# Patient Record
Sex: Female | Born: 1955 | Race: White | Hispanic: No | Smoking: Former smoker
Health system: Southern US, Community
[De-identification: ages and names within clinical notes are randomized; demographics above are authoritative.]

## PROBLEM LIST (undated history)

## (undated) DIAGNOSIS — M810 Age-related osteoporosis without current pathological fracture: Secondary | ICD-10-CM

## (undated) DIAGNOSIS — L409 Psoriasis, unspecified: Secondary | ICD-10-CM

## (undated) DIAGNOSIS — I10 Essential (primary) hypertension: Secondary | ICD-10-CM

## (undated) HISTORY — PX: ABDOMINAL HYSTERECTOMY: SHX81

## (undated) HISTORY — PX: ARTHROSCOPIC REPAIR ACL: SUR80

## (undated) HISTORY — DX: Age-related osteoporosis without current pathological fracture: M81.0

## (undated) HISTORY — DX: Essential (primary) hypertension: I10

## (undated) HISTORY — PX: RHINOPLASTY: SUR1284

---

## 1999-07-07 ENCOUNTER — Encounter (INDEPENDENT_AMBULATORY_CARE_PROVIDER_SITE_OTHER): Payer: Self-pay

## 1999-07-07 ENCOUNTER — Other Ambulatory Visit: Admission: RE | Admit: 1999-07-07 | Discharge: 1999-07-07 | Payer: Self-pay | Admitting: Obstetrics & Gynecology

## 1999-12-03 ENCOUNTER — Encounter: Admission: RE | Admit: 1999-12-03 | Discharge: 1999-12-03 | Payer: Self-pay | Admitting: Obstetrics & Gynecology

## 1999-12-03 ENCOUNTER — Encounter: Payer: Self-pay | Admitting: Obstetrics & Gynecology

## 2000-12-28 ENCOUNTER — Encounter: Admission: RE | Admit: 2000-12-28 | Discharge: 2000-12-28 | Payer: Self-pay | Admitting: Obstetrics & Gynecology

## 2000-12-28 ENCOUNTER — Encounter: Payer: Self-pay | Admitting: Obstetrics & Gynecology

## 2001-01-05 ENCOUNTER — Other Ambulatory Visit: Admission: RE | Admit: 2001-01-05 | Discharge: 2001-01-05 | Payer: Self-pay | Admitting: Obstetrics & Gynecology

## 2001-12-30 ENCOUNTER — Encounter: Payer: Self-pay | Admitting: Obstetrics & Gynecology

## 2001-12-30 ENCOUNTER — Encounter: Admission: RE | Admit: 2001-12-30 | Discharge: 2001-12-30 | Payer: Self-pay | Admitting: Obstetrics & Gynecology

## 2002-03-14 ENCOUNTER — Other Ambulatory Visit: Admission: RE | Admit: 2002-03-14 | Discharge: 2002-03-14 | Payer: Self-pay | Admitting: Obstetrics & Gynecology

## 2003-01-09 ENCOUNTER — Encounter: Payer: Self-pay | Admitting: Obstetrics & Gynecology

## 2003-01-09 ENCOUNTER — Encounter: Admission: RE | Admit: 2003-01-09 | Discharge: 2003-01-09 | Payer: Self-pay | Admitting: Obstetrics & Gynecology

## 2003-04-10 ENCOUNTER — Other Ambulatory Visit: Admission: RE | Admit: 2003-04-10 | Discharge: 2003-04-10 | Payer: Self-pay | Admitting: Obstetrics & Gynecology

## 2003-07-06 ENCOUNTER — Ambulatory Visit (HOSPITAL_COMMUNITY): Admission: RE | Admit: 2003-07-06 | Discharge: 2003-07-06 | Payer: Self-pay | Admitting: Family Medicine

## 2004-01-25 ENCOUNTER — Encounter: Admission: RE | Admit: 2004-01-25 | Discharge: 2004-01-25 | Payer: Self-pay | Admitting: Obstetrics & Gynecology

## 2005-01-30 ENCOUNTER — Encounter: Admission: RE | Admit: 2005-01-30 | Discharge: 2005-01-30 | Payer: Self-pay | Admitting: Emergency Medicine

## 2005-02-16 ENCOUNTER — Encounter: Admission: RE | Admit: 2005-02-16 | Discharge: 2005-02-16 | Payer: Self-pay | Admitting: Emergency Medicine

## 2005-04-24 ENCOUNTER — Encounter: Admission: RE | Admit: 2005-04-24 | Discharge: 2005-04-24 | Payer: Self-pay | Admitting: Emergency Medicine

## 2005-09-10 ENCOUNTER — Ambulatory Visit (HOSPITAL_COMMUNITY): Admission: RE | Admit: 2005-09-10 | Discharge: 2005-09-10 | Payer: Self-pay | Admitting: Obstetrics and Gynecology

## 2005-09-10 ENCOUNTER — Encounter (INDEPENDENT_AMBULATORY_CARE_PROVIDER_SITE_OTHER): Payer: Self-pay | Admitting: Specialist

## 2005-10-02 ENCOUNTER — Inpatient Hospital Stay (HOSPITAL_COMMUNITY): Admission: RE | Admit: 2005-10-02 | Discharge: 2005-10-03 | Payer: Self-pay | Admitting: Obstetrics & Gynecology

## 2005-10-02 ENCOUNTER — Encounter (INDEPENDENT_AMBULATORY_CARE_PROVIDER_SITE_OTHER): Payer: Self-pay | Admitting: Specialist

## 2006-02-17 ENCOUNTER — Encounter: Admission: RE | Admit: 2006-02-17 | Discharge: 2006-02-17 | Payer: Self-pay | Admitting: Emergency Medicine

## 2007-02-21 ENCOUNTER — Encounter: Admission: RE | Admit: 2007-02-21 | Discharge: 2007-02-21 | Payer: Self-pay | Admitting: Emergency Medicine

## 2008-02-24 ENCOUNTER — Encounter: Admission: RE | Admit: 2008-02-24 | Discharge: 2008-02-24 | Payer: Self-pay | Admitting: Emergency Medicine

## 2009-02-25 ENCOUNTER — Encounter: Admission: RE | Admit: 2009-02-25 | Discharge: 2009-02-25 | Payer: Self-pay | Admitting: Emergency Medicine

## 2009-10-18 ENCOUNTER — Ambulatory Visit (HOSPITAL_COMMUNITY): Admission: RE | Admit: 2009-10-18 | Discharge: 2009-10-18 | Payer: Self-pay | Admitting: Dermatology

## 2010-02-26 ENCOUNTER — Encounter: Admission: RE | Admit: 2010-02-26 | Discharge: 2010-02-26 | Payer: Self-pay | Admitting: Family Medicine

## 2010-06-29 ENCOUNTER — Encounter: Payer: Self-pay | Admitting: Obstetrics & Gynecology

## 2010-07-10 ENCOUNTER — Other Ambulatory Visit (HOSPITAL_COMMUNITY): Payer: Self-pay | Admitting: Family Medicine

## 2010-08-26 LAB — CBC
Hemoglobin: 14.5 g/dL (ref 12.0–15.0)
MCV: 100.8 fL — ABNORMAL HIGH (ref 78.0–100.0)
Platelets: 201 10*3/uL (ref 150–400)
RBC: 4.14 MIL/uL (ref 3.87–5.11)
WBC: 6.2 10*3/uL (ref 4.0–10.5)

## 2010-08-26 LAB — APTT: aPTT: 33 seconds (ref 24–37)

## 2010-08-26 LAB — PROTIME-INR: INR: 0.98 (ref 0.00–1.49)

## 2010-10-24 NOTE — Op Note (Signed)
[name], [name]               ACCOUNT NO.:  [id number]   MEDICAL RECORD NO.:  [id number]          PATIENT TYPE:  AMB   LOCATION:  SDC                           FACILITY:  [facility]   PHYSICIAN:  Richardean Sale, M.D.   DATE OF BIRTH:  04-04-1956   DATE OF PROCEDURE:  09/10/2005  DATE OF DISCHARGE:                                 OPERATIVE REPORT   PREOPERATIVE DIAGNOSIS:  Prolapsed submucosal uterine fibroid.   POSTOPERATIVE DIAGNOSIS:  Prolapsed submucosal uterine fibroid.   PROCEDURE:  Vaginal myomectomy, hysteroscopy with resection of uterine  fibroid and dilatation and curettage.   SURGEON:  Richardean Sale, M.D.   ASSISTANT:  None.   COMPLICATIONS:  None.   ESTIMATED BLOOD LOSS:  Minimal.   FLUID DEFICIT:  150 mL.   FINDINGS:  An approximately 4 cm diameter submucosal fibroid prolapsing  through the uterine cervix into the vagina with the base of approximately 2  cm in diameter.   INDICATIONS:  This is a 55 year old para 1 white female who presented to the  office with 5 days of heavy vaginal bleeding and vaginal discharge with  lower back and lower pelvic cramping.  On evaluation, the patient was found  to have a 4 cm diameter uterine fibroid prolapsing through the cervix.  The  patient presents for vaginal myomectomy with possible hysteroscopy, with  resection of uterine fibroid and D&C.  Prior to procedure, the risks,  benefits and alternatives of the procedure were reviewed with the patient in  detail.  We discussed the risks which include but are not limited to  hemorrhage requiring transfusion, infection, uterine perforation which could  cause injury to bowel or bladder and require additional surgery, and  anesthesia related risk.  The patient voiced understanding of all these  risks and agrees to proceed.  Informed consent was then obtained.   PROCEDURE:  The patient was taken to the operating room where she was given  a general anesthetic.  She was then prepped  and draped in the usual sterile  fashion with Betadine.  A red rubber catheter was used to drain the bladder.  On bimanual exam, a fibroid was easily palpable in the cervical os  approximately 4 cm in diameter and at least protruding by 4 cm as well.  The  uterus was otherwise normal size, retroverted, mobile with no obvious  adnexal masses.  A speculum was then placed in the vagina and the fibroid  was easily visualized.  It was grasped with single-tooth tenaculum.  The  base was approximately 2 cm in diameter and the fibroid was unable to be  removed with a twisting motion.  Therefore, the Bovie was used after the  Bovie pad had been secured to the patient's thigh.  The Bovie was used to  transect the fibroid at the visible base.  The specimen was then sent to  pathology labeled as uterine fibroid.  The base appeared hemostatic but  there still appeared to be a significant amount of the fibroid present in  the endocervical canal.  Therefore, the operative hysteroscope was  introduced first by  placing a single-tooth tenaculum on the anterior lip of  the cervix.  With entry of the hysteroscope, there was significant amount of  fluid being lost from the cervix as the cervix was already significantly  dilated from the prolapse fibroid.  Therefore, a second tenaculum was then  placed along the posterior lip of the cervix to help lessen the diameter of  the cervix and hysteroscopy was then able to be performed.  Upon inspection  of the cavity, the base of the fibroid could be visualized.  It appeared  hemostatic but there was still approximately 2 cm of fibroid remaining.  Therefore, the resectoscope was then used to shave down the base of the  fibroid.  These specimens were also sent to pathology with the original  uterine fibroid.  Once this was completed, a sharp curettage was performed.  The multiple small fibroids fragments of fibroid were removed.  The polyp  forceps were also used to  help remove any additional fragments.  Once this  was complete, the hysteroscope was then reintroduced.  The suction tubing  then spontaneously disconnected from the hysteroscope and approximately 300  mL of fluid were lost on the floor.  Suction tubing was replaced.  Hysteroscopy was performed.  There were multiple small fibroid pieces  floating in the endometrial cavity.  Hysteroscope was removed and again the  forceps were then used to remove any additional fragments.  The hysteroscope  was reintroduced one last time.  The endometrial cavity now appeared normal.  There was a small amount of bleeding coming from the base of the fibroid.  This was cauterized with the wire loop for the resectoscope.  Hemostasis was  then confirmed.  The hysteroscope was then removed and the tenaculum was  removed from the patient's cervix.  There was minimal bleeding coming from  the cervical os and no bleeding coming from the tenaculum site.  At this  point, the procedure was then terminated.  All sponge, lap, needles and  instrument counts were correct x2.  The patient was then taken out of the  dorsal lithotomy position, was awakened from anesthesia and was transferred  to recovery room awake and in stable condition.  There were no  complications.      Richardean Sale, M.D.  Electronically Signed     JW/MEDQ  D:  09/10/2005  T:  09/11/2005  Job:  [id number]

## 2010-10-24 NOTE — Op Note (Signed)
Karla, [name]               ACCOUNT NO.:  [id number]   MEDICAL RECORD NO.:  [id number]          PATIENT TYPE:  INP   LOCATION:  1603                         FACILITY:  [facility]   PHYSICIAN:  Genia Del, M.D.DATE OF BIRTH:  1955-06-09   DATE OF PROCEDURE:  10/02/2005  DATE OF DISCHARGE:  10/03/2005                                 OPERATIVE REPORT   PREOPERATIVE DIAGNOSES:  1.  Refractory menorrhagia.  2.  Uterine myoma.   POSTOPERATIVE DIAGNOSES:  1.  Refractory menorrhagia.  2.  Uterine myoma.  3.  Mild pelvic endometriosis.   PROCEDURE:  Laparoscopy-assisted vaginal hysterectomy plus bilateral  salpingo-oophorectomy, da Vinci robot-assisted.   SURGEON:  Genia Del, M.D.   ASSISTANT:  Richardean Sale, M.D.   PROCEDURE:  Under general anesthesia with endotracheal intubation, the  patient is in lithotomy position for operative laparoscopy.  She is prepped  with Betadine on the abdominal, suprapubic, vulvar and vaginal areas and  draped as usual.  The bladder is catheterized and the Foley is kept in  place.  We go vaginally and insert the weighted speculum.  We grasp the  anterior lip of the cervix with a tenaculum.  The uterus is cannulated with  the ZUMI and a co-ring is used at the cervix.  A stitch of 0 Vicryl is done  at that level on each side to secure the co-ring.  We then remove the  weighted speculum.  We go abdominally.  We measure the site for the camera  at 19 cm above the symphysis pubis, which is about 3 cm above the umbilicus.  We make a 1 cm incision with a scalpel at that level.  We use the Veress  needle to create a pneumoperitoneum.  The abdominal wall is raised as the  Veress needle is inserted.  The security tests are done.  We then create a  pneumoperitoneum with 2.5 L of CO2.  We then remove the Veress needle.  The  trocar is inserted at that level and the camera is inserted.  The uterus is  slightly irregular, mild increase in volume  because of fibroids.  We note  two normal tubes.  The ovaries are normal in size and appearance on each  side.  There is endometriosis, especially lesions are seen at the  uterosacral ligament on the left side and toward the left infundibulopelvic  ligament as well.  Other lesions are more superficial.  No adhesion is  present.  We then make the measurement for the port sites.  We put the two  main robotic arms on the right and left about at the level of the umbilicus.  We put the fourth arm on the left and the assistant port on the right.  We  make incisions with a scalpel measuring about 5 mm at each port entrance  except for the assistant port, which is 10 mm.  Marcaine 0.25% plain is  injected subcutaneously at all levels.  We then insert all the trocars under  direct vision.  We then dock the robot easily.  The instruments are put in  place,  on the right arm a shear scissors, on the left arm the [place]  bipolar, and on the fourth arm the tenaculum.  My assistant uses a traumatic  clamp to retract the uterus on each side.  We start on the left side.  Round  ligament is cauterized and cut using the [place] and the shear scissors.  We then open the peritoneum anteriorly and start declining the bladder on  that side.  We then visualize the last ureter.  This is made more difficult  by the endometriosis at that level.  We cauterize and cut the left  infundibulopelvic ligament.  We follow along the side of the uterus and  cauterize the left uterine artery.  We then continue on the right side and  proceed exactly the same way.  The right ureter is more easily visualized  and away from the surgical area.  The round ligament is cauterized and cut.  The right infundibulopelvic ligament is cauterized and cut and we go along  the uterus on the right side.  We reach the right uterine artery.  It is  cauterized and then cut.  We cut the left uterine artery after cauterization  also.  We recline  the bladder further down until we see about 2 cm of the  vaginal wall.  We then push the co-ring and start the circular resection  with the point of the monopolar scissors all around.  We start anteriorly to  the left lateral aspect of the vagina and then proceed to the right aspect  and then finish posteriorly.  We remove the uterus with the cervix, tubes  and both ovaries in one block and send it to pathology.  We then put a glove  with a lap inside to occlude the vagina to preserve the pneumoperitoneum.  We then close the vaginal vault with a Vicryl 0 10 inches in two half-  running sutures.  The vaginal vault is completely closed with that.  We then  verify hemostasis at all pedicles.  We remove the pneumoperitoneum partially  to decrease the pressure.  Hemostasis is adequate at all levels.  The  instruments are therefore removed.  The robot is undocked.  The CO2 is  evacuated.  All trocars are removed.  We close the aponeurosis with a Vicryl  0 at the supraumbilical incision.  The other incisions are too small to  close it.  We use a 4-0 Vicryl to do a subcuticular stitch at the  supraumbilical incision and at the assistant port incision, and we put  Dermabond on all incisions.  The count of instruments and sponges was  complete.  The estimated blood loss was less than 100 mL.  No complication  occurred, and the patient was brought to the recovery room in good status.      Genia Del, M.D.  Electronically Signed     ML/MEDQ  D:  10/02/2005  T:  10/03/2005  Job:  [id number]

## 2011-01-27 ENCOUNTER — Other Ambulatory Visit: Payer: Self-pay | Admitting: Family Medicine

## 2011-01-27 DIAGNOSIS — Z1231 Encounter for screening mammogram for malignant neoplasm of breast: Secondary | ICD-10-CM

## 2011-03-02 ENCOUNTER — Ambulatory Visit
Admission: RE | Admit: 2011-03-02 | Discharge: 2011-03-02 | Disposition: A | Discharge status: Home / Self Care | Payer: 59 | Source: Ambulatory Visit | Attending: Family Medicine | Admitting: Family Medicine

## 2011-03-02 DIAGNOSIS — Z1231 Encounter for screening mammogram for malignant neoplasm of breast: Secondary | ICD-10-CM

## 2011-03-06 ENCOUNTER — Other Ambulatory Visit: Payer: Self-pay | Admitting: Family Medicine

## 2011-03-06 DIAGNOSIS — N63 Unspecified lump in unspecified breast: Secondary | ICD-10-CM

## 2011-03-23 ENCOUNTER — Other Ambulatory Visit: Payer: Self-pay | Admitting: Family Medicine

## 2011-03-23 ENCOUNTER — Ambulatory Visit
Admission: RE | Admit: 2011-03-23 | Discharge: 2011-03-23 | Disposition: A | Discharge status: Home / Self Care | Payer: 59 | Source: Ambulatory Visit | Attending: Family Medicine | Admitting: Family Medicine

## 2011-03-23 DIAGNOSIS — N63 Unspecified lump in unspecified breast: Secondary | ICD-10-CM

## 2011-05-17 IMAGING — MG MM DIGITAL DIAGNOSTIC BILAT
5 series · 5 of 5 positions shown · non-contrast
Comparison: [DATE] [DATE], [DATE], [DATE] [DATE], [DATE], [DATE] [DATE],
[DATE]

CLINICAL DATA: Palpable lumps left breast

DIGITAL DIAGNOSTIC BILATERAL MAMMOGRAM WITH CAD AND LEFT BREAST
ULTRASOUND:

[R CC]
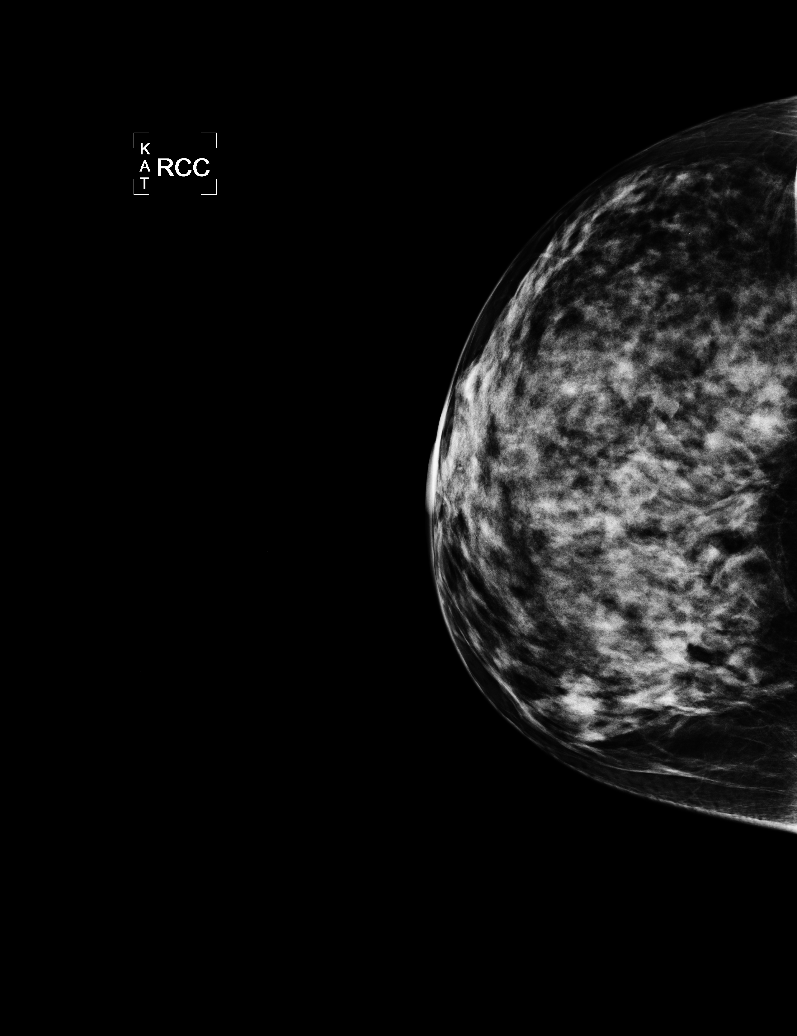

[L CC]
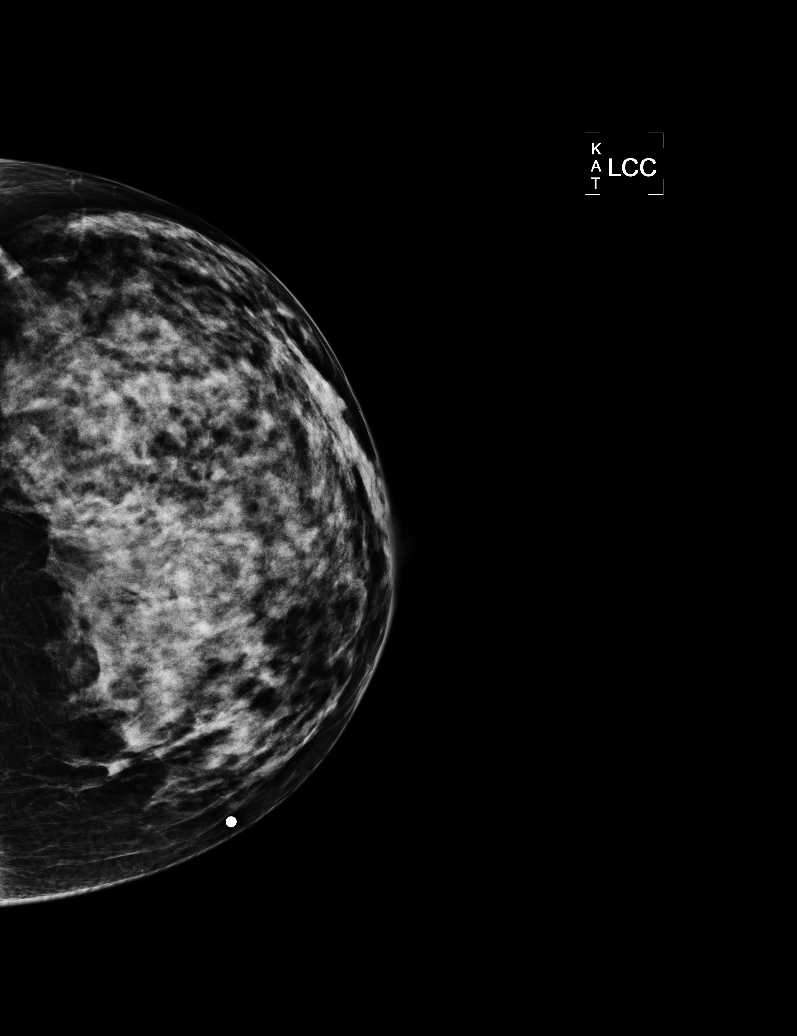

[L MLO]
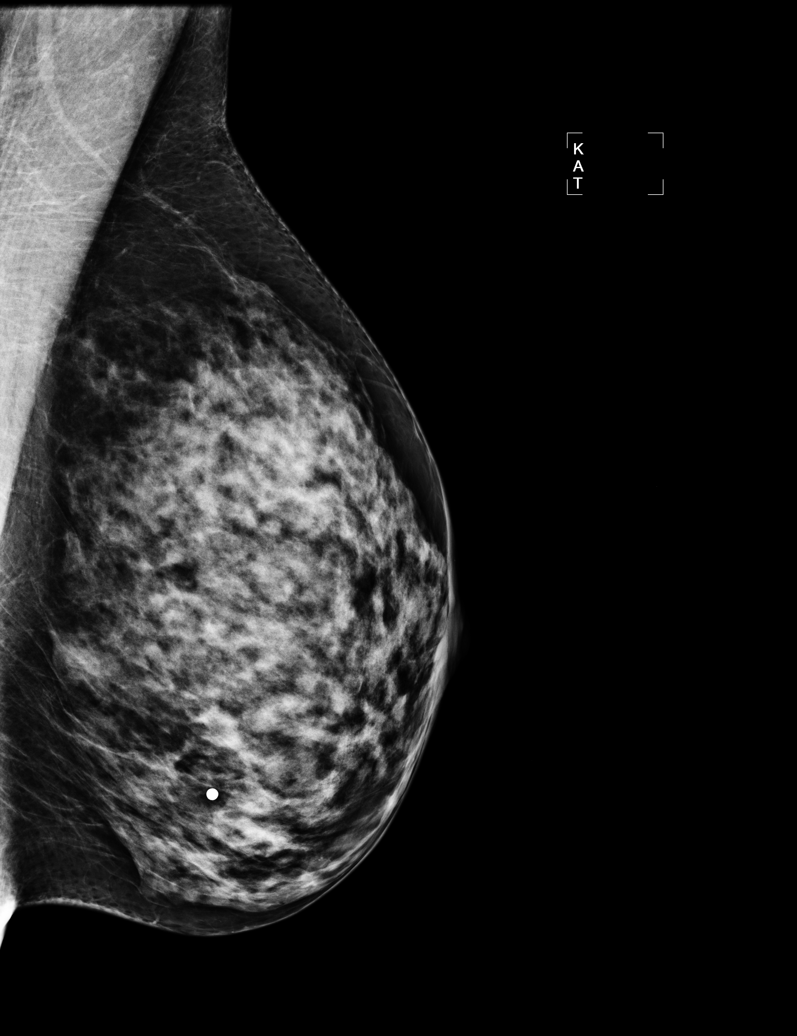

[R MLO]
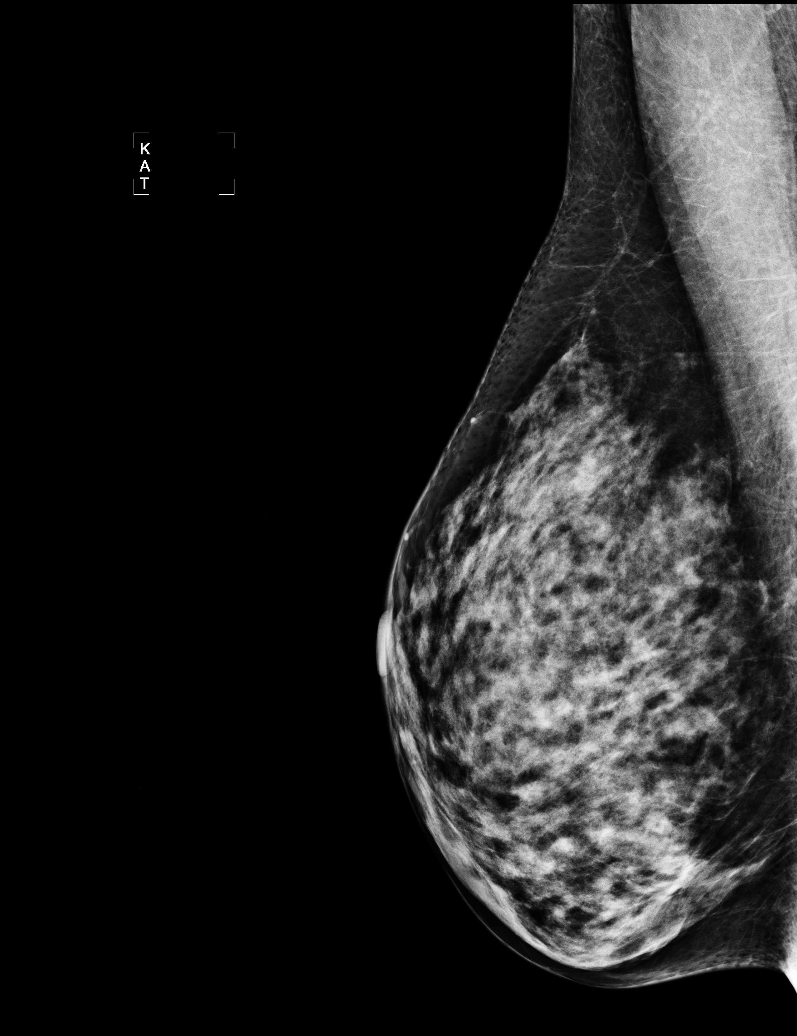

[L TAN]
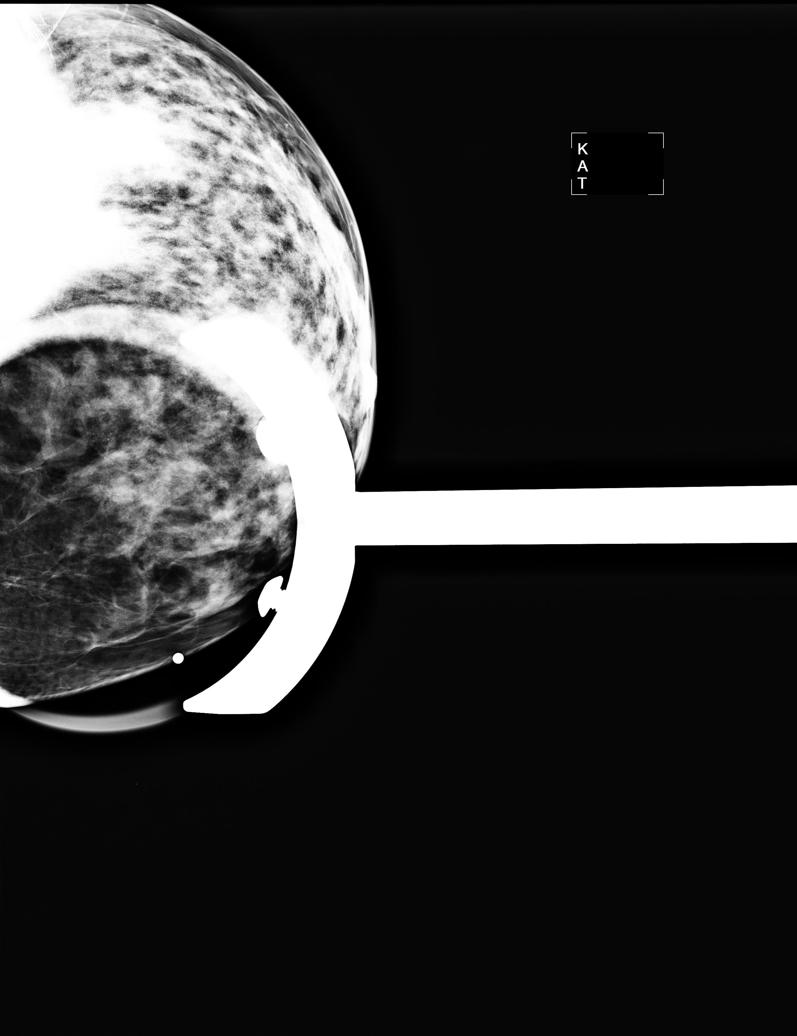

[5 of 5 positions shown; findings below may reference images not displayed]

FINDINGS: CC and MLO views of bilateral breast and spot tangential
view of left breast are submitted.  The breast parenchyma is dense,
lowering the sensitivity of mammography.  No suspicious abnormality
is identified in both breasts.
Mammographic images were processed with CAD.

Ultrasound is performed, showing no focal discrete cystic or solid
lesion at the left breast palpable areas, two to three o'clock
position and eight o'clock positions.
IMPRESSION: Negative, recommend routine screening mammogram in 1 year

BI-RADS CATEGORY 1:  Negative.

## 2012-02-17 ENCOUNTER — Other Ambulatory Visit: Payer: Self-pay | Admitting: Family Medicine

## 2012-02-17 DIAGNOSIS — Z1231 Encounter for screening mammogram for malignant neoplasm of breast: Secondary | ICD-10-CM

## 2012-04-04 ENCOUNTER — Ambulatory Visit
Admission: RE | Admit: 2012-04-04 | Discharge: 2012-04-04 | Disposition: A | Discharge status: Home / Self Care | Payer: 59 | Source: Ambulatory Visit | Attending: Family Medicine | Admitting: Family Medicine

## 2012-04-04 DIAGNOSIS — Z1231 Encounter for screening mammogram for malignant neoplasm of breast: Secondary | ICD-10-CM

## 2013-02-28 ENCOUNTER — Other Ambulatory Visit: Payer: Self-pay

## 2013-02-28 DIAGNOSIS — Z1231 Encounter for screening mammogram for malignant neoplasm of breast: Secondary | ICD-10-CM

## 2013-04-05 ENCOUNTER — Ambulatory Visit
Admission: RE | Admit: 2013-04-05 | Discharge: 2013-04-05 | Disposition: A | Discharge status: Home / Self Care | Payer: 59 | Source: Ambulatory Visit

## 2013-04-05 DIAGNOSIS — Z1231 Encounter for screening mammogram for malignant neoplasm of breast: Secondary | ICD-10-CM

## 2013-09-15 ENCOUNTER — Ambulatory Visit
Admission: RE | Admit: 2013-09-15 | Discharge: 2013-09-15 | Disposition: A | Discharge status: Home / Self Care | Payer: 59 | Source: Ambulatory Visit | Attending: Family Medicine | Admitting: Family Medicine

## 2013-09-15 ENCOUNTER — Other Ambulatory Visit: Payer: Self-pay | Admitting: Family Medicine

## 2013-09-15 DIAGNOSIS — T148XXA Other injury of unspecified body region, initial encounter: Secondary | ICD-10-CM

## 2014-03-05 ENCOUNTER — Other Ambulatory Visit: Payer: Self-pay

## 2014-03-05 DIAGNOSIS — Z1231 Encounter for screening mammogram for malignant neoplasm of breast: Secondary | ICD-10-CM

## 2014-04-06 ENCOUNTER — Ambulatory Visit
Admission: RE | Admit: 2014-04-06 | Discharge: 2014-04-06 | Disposition: A | Discharge status: Home / Self Care | Payer: 59 | Source: Ambulatory Visit

## 2014-04-06 ENCOUNTER — Encounter (INDEPENDENT_AMBULATORY_CARE_PROVIDER_SITE_OTHER): Payer: Self-pay

## 2014-04-06 DIAGNOSIS — Z1231 Encounter for screening mammogram for malignant neoplasm of breast: Secondary | ICD-10-CM

## 2014-04-11 ENCOUNTER — Other Ambulatory Visit: Payer: Self-pay | Admitting: Family Medicine

## 2014-04-11 DIAGNOSIS — R928 Other abnormal and inconclusive findings on diagnostic imaging of breast: Secondary | ICD-10-CM

## 2014-04-25 ENCOUNTER — Ambulatory Visit
Admission: RE | Admit: 2014-04-25 | Discharge: 2014-04-25 | Disposition: A | Discharge status: Home / Self Care | Payer: 59 | Source: Ambulatory Visit | Attending: Family Medicine | Admitting: Family Medicine

## 2014-04-25 DIAGNOSIS — R928 Other abnormal and inconclusive findings on diagnostic imaging of breast: Secondary | ICD-10-CM

## 2015-03-26 ENCOUNTER — Other Ambulatory Visit: Payer: Self-pay

## 2015-03-26 DIAGNOSIS — Z1231 Encounter for screening mammogram for malignant neoplasm of breast: Secondary | ICD-10-CM

## 2015-05-06 ENCOUNTER — Ambulatory Visit
Admission: RE | Admit: 2015-05-06 | Discharge: 2015-05-06 | Disposition: A | Discharge status: Home / Self Care | Payer: 59 | Source: Ambulatory Visit

## 2015-05-06 DIAGNOSIS — Z1231 Encounter for screening mammogram for malignant neoplasm of breast: Secondary | ICD-10-CM

## 2015-10-21 ENCOUNTER — Encounter: Payer: Self-pay | Admitting: Genetic Counselor

## 2015-10-21 ENCOUNTER — Ambulatory Visit
Admission: RE | Admit: 2015-10-21 | Discharge: 2015-10-21 | Disposition: A | Discharge status: Home / Self Care | Payer: 59 | Source: Ambulatory Visit | Attending: Family Medicine | Admitting: Family Medicine

## 2015-10-21 ENCOUNTER — Other Ambulatory Visit: Payer: Self-pay | Admitting: Family Medicine

## 2015-10-21 DIAGNOSIS — Z8709 Personal history of other diseases of the respiratory system: Secondary | ICD-10-CM

## 2015-10-21 DIAGNOSIS — R062 Wheezing: Secondary | ICD-10-CM

## 2015-11-28 ENCOUNTER — Ambulatory Visit (HOSPITAL_BASED_OUTPATIENT_CLINIC_OR_DEPARTMENT_OTHER): Payer: 59 | Admitting: Genetic Counselor

## 2015-11-28 ENCOUNTER — Other Ambulatory Visit: Payer: 59

## 2015-11-28 ENCOUNTER — Encounter: Payer: Self-pay | Admitting: Genetic Counselor

## 2015-11-28 DIAGNOSIS — Z8051 Family history of malignant neoplasm of kidney: Secondary | ICD-10-CM

## 2015-11-28 DIAGNOSIS — Z808 Family history of malignant neoplasm of other organs or systems: Secondary | ICD-10-CM | POA: Diagnosis not present

## 2015-11-28 DIAGNOSIS — Z809 Family history of malignant neoplasm, unspecified: Secondary | ICD-10-CM

## 2015-11-28 DIAGNOSIS — Z803 Family history of malignant neoplasm of breast: Secondary | ICD-10-CM

## 2015-11-28 DIAGNOSIS — Z801 Family history of malignant neoplasm of trachea, bronchus and lung: Secondary | ICD-10-CM | POA: Diagnosis not present

## 2015-11-28 DIAGNOSIS — Z315 Encounter for genetic counseling: Secondary | ICD-10-CM

## 2015-11-28 NOTE — Progress Notes (Signed)
REFERRING PROVIDER: Kelton Pillar, MD [address] [place], [place] [address]  PRIMARY PROVIDER:  No primary care provider on file.  PRIMARY REASON FOR VISIT:  1. Family history of breast cancer in female   2. Family history of brain cancer   3. Family history of kidney cancer   4. Family history of lung cancer   5. Family history of cancer      HISTORY OF PRESENT ILLNESS:   Karla [name], a 60 y.o. female, was seen for a [facility] cancer genetics consultation at the request of Dr. Laurann Montana due to a family history of breast cancer.  Karla [name] presents to clinic today to discuss the possibility of a hereditary predisposition to cancer, genetic testing, and to further clarify her future cancer risks, as well as potential cancer risks for family members.    Karla [name] is a 60 y.o. female with no personal history of cancer.    HORMONAL RISK FACTORS:  Menarche was at age 66.  First live birth at age 80.  OCP use for approximately 8 years.  Ovaries intact: no.  Hysterectomy: yes.  Menopausal status: postmenopausal.  HRT use: 6 months. Colonoscopy: yes; has had two colonoscopies so far (5-year schedule); total of 2 polyps found. Mammogram within the last year: yes. Number of breast biopsies: 0. Up to date with pelvic exams:  yes. 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  No past surgical history on file.  Social History   Social History  . Marital Status: Single    Spouse Name: N/A  . Number of Children: N/A  . Years of Education: N/A   Social History Main Topics  . Smoking status: Former Smoker -- 1.00 packs/day for 35 years    Types: Cigarettes    Quit date: 06/08/2008  . Smokeless tobacco: Never Used  . Alcohol Use: Not on file     Comment: maybe 1 glass wine per day (11/28/15)  . Drug Use:  Not on file  . Sexual Activity: Not on file   Other Topics Concern  . Not on file   Social History Narrative  . No narrative on file     FAMILY HISTORY:  We obtained a detailed, 4-generation family history.  Significant diagnoses are listed below: Family History  Problem Relation Age of Onset  . Breast cancer Mother 34    w/ recurrence (x2); s/p mastectomy  . Brain cancer Maternal Uncle     dx. late 61s; NOS type  . Kidney cancer Paternal Aunt     NOS type; dx. 55s; former smoker  . Breast cancer Maternal Grandmother 63  . Lung cancer Maternal Grandfather 18    former smoker  . Cancer Paternal Grandfather     possible brain cancer; dx. 77s  . Cancer Other     maternal great aunt (MGF's sister) dx NOS cancer at later age  . Cancer Other     maternal great uncle (MGF's brother) dx NOS cancer at later age  . Lung cancer Other     paternal great uncle (PGM's brother) d. 41s; +smoker    Karla [name] has one daughter who is currently 84 and who has never had cancer.  She has one grandson who is 25.  She has two brothers, ages 68 and 65, who are also cancer-free.  Karla [name] mother is currently 35; she has a history of breast cancer diagnosed at 30 with two recurrences.  Karla [name] father died from an appendix rupture following a freak accident at the age of 67.    Karla [name] mother had one full brother.  He passed away of an unspecified type of brain cancer in his late 47s.  He has two children (one son and one daughter), neither of whom have ever been diagnosed with cancer.  Karla [name] maternal grandmother was diagnosed with breast cancer at 68 and passed away at 75.  Karla [name] maternal grandfather died of lung cancer at 73--he was a former smoker.  He had two full brothers and five full sisters.  One brother and one sister both died of an unspecified type of cancer at later ages.  Karla [name] father had two full sisters.  One was a smoker and died of kidney cancer in her 3s.  The  other sister died of heart-related causes in her late 39s.  Karla [name] reports no known cancers for her paternal first cousins.  Her paternal grandmother died in her 45s.  She had at least one brother who died of lung cancer in his 59s (he was a smoker).  Karla [name] paternal grandfather died of a possible brain cancer in his 12s.  She is unaware of any previous family history of genetic testing for hereditary cancer.  Patient's maternal ancestors are of [place] and [place] descent, and paternal ancestors are of [place] descent. There is no reported Ashkenazi Jewish ancestry. There is no known consanguinity.  GENETIC COUNSELING ASSESSMENT: Karla [name] is a 60 y.o. female with a maternal family history of breast cancer. We, therefore, discussed and recommended the following at today's visit.   DISCUSSION: We reviewed the characteristics, features and inheritance patterns of hereditary cancer syndromes. We discussed that only approximately 5-10% of cancer is genetic, and that most cancer is sporadic.  We discussed that there are certain "red flags" that we can typically pick up on in someone's personal and/or family history of cancer that makes [place] more suspicious of a genetic cause.  Those red flags include cancers diagnosed at 34 years of age or younger, multiple cancers in a family and in multiple generations of a family, individuals who have had multiple primary cancers, or seeing certain rare cancers in a family.    We discussed with Ms. Betsch that the family history is not highly consistent with a familial hereditary cancer syndrome, and we feel she is at low risk to harbor a gene mutation associated with such a condition.  Her mother and grandmother both have a history of breast cancer but were diagnosed at ages older than 16.  However, we also discussed that there are several men (and less women) on her mother's side of the family and that this gives [place] somewhat less information.  Ms. Wyke said that  she was more reassured by this review of the family history, but would still like to proceed with genetic testing.  We discussed that she does not meet her insurance criteria to have genetic testing cost covered by her insurance.  Thus, we discussed [facility] self-pay genetic testing option.  Ms. Harbour would like to proceed with genetic testing through [facility].  Thus, we recommended testing via the 30-gene [facility] Hereditary Cancer Panel.  The 30-gene Panel through [facility] [place], [place]) includes sequencing and/or deletion/duplication analysis of the following genes: APC, ATM, BAP1, BARD1,  BMPR1A, BRCA1, BRCA2, BRIP1, CDH1, CDK4, CDKN2A, CHEK2, EPCAM, GREM1, MITF, MLH1, MSH2, MSH6, MUTYH, NBN, PALB2, PMS2, POLD1, POLE, PTEN, RAD51C, RAD51D, SMAD4, STK11, and TP53.  We discussed the process of testing, insurance coverage and turn-around-time for results. We discussed the implications of a negative, positive and/or variant of uncertain significant result.   Ms. Scruton provided her consent to pursue this testing and she provided credit card payment.  She took the saliva test kit home with her and will obtain a saliva sample at home, then mail to [facility].  We discussed that it takes about 3 weeks to get results back following the lab's receipt of her test kit.  She is welcome to call with any questions she has in regards to collecting the sample and mailing the kit out.  Based on the patient's personal and family history, a statistical model (IBIS/Tyrer-Cuzick) and literature data were used to estimate her risk of developing breast cancer. This estimate her lifetime risk of developing breast cancer to be approximately 17.6%. This estimation does not take into account any genetic testing results.  The patient's lifetime breast cancer risk is a preliminary estimate based on available information using one of several models endorsed by the  [employer] (ACS). The ACS recommends consideration of breast MRI screening as an adjunct to mammography for patients at high risk (defined as 20% or greater lifetime risk). A more detailed breast cancer risk assessment can be considered, if clinically indicated.   PLAN: After considering the risks, benefits, and limitations, Ms. Cuello  provided informed consent to pursue genetic testing and the blood sample was sent to [facility] for analysis of the 30-gene Hereditary Cancer Panel. Results should be available within approximately 3 weeks' time, at which point they will be disclosed by telephone to Ms. Hubert, as will any additional recommendations warranted by these results. Ms. Totman will receive a summary of her genetic counseling visit and a copy of her results once available. This information will also be available in Epic. We encouraged Ms. Lubbers to remain in contact with cancer genetics annually so that we can continuously update the family history and inform her of any changes in cancer genetics and testing that may be of benefit for her family. Ms. Ranker questions were answered to her satisfaction today. Our contact information was provided should additional questions or concerns arise.  Thank you for the referral and allowing [place] to share in the care of your patient.   Jeanine Luz, MS, [facility] Certified Genetic Counselor [email or web address] Phone: [phone]  The patient was seen for a total of 55 minutes in face-to-face genetic counseling.  This patient was discussed with Drs. Magrinat, Lindi Adie and/or Burr Medico who agrees with the above.    _______________________________________________________________________ For Office Staff:  Number of people involved in session: 1 Was an Intern/ student involved with case: no

## 2015-12-30 ENCOUNTER — Telehealth: Payer: Self-pay | Admitting: Genetic Counselor

## 2015-12-30 NOTE — Telephone Encounter (Signed)
Discussed with Ms. Mantei that her genetic test result was negative for pathogenic mutations within any of 30 genes on the Hereditary Cancer Panel through [facility].  Additionally, no uncertain changes were found.  Discussed that this is most likely a reassuring result for [place], though we cannot 100% rule out a genetic risk for breast cancer as our testing may not be perfect currently.  She should continue to follow her doctors' recommendations for future cancer screening, including continuing with annual mammograms and breast exams.  Her daughter an niece should begin mammograms at age 84.  She is welcome to call with any questions.

## 2015-12-31 ENCOUNTER — Ambulatory Visit: Payer: Self-pay | Admitting: Genetic Counselor

## 2015-12-31 DIAGNOSIS — Z1379 Encounter for other screening for genetic and chromosomal anomalies: Secondary | ICD-10-CM

## 2015-12-31 DIAGNOSIS — Z803 Family history of malignant neoplasm of breast: Secondary | ICD-10-CM

## 2015-12-31 DIAGNOSIS — Z809 Family history of malignant neoplasm, unspecified: Secondary | ICD-10-CM

## 2016-03-30 ENCOUNTER — Other Ambulatory Visit: Payer: Self-pay | Admitting: Family Medicine

## 2016-03-30 DIAGNOSIS — Z1231 Encounter for screening mammogram for malignant neoplasm of breast: Secondary | ICD-10-CM

## 2016-04-06 ENCOUNTER — Encounter: Payer: Self-pay | Admitting: Genetic Counselor

## 2016-04-06 DIAGNOSIS — Z1379 Encounter for other screening for genetic and chromosomal anomalies: Secondary | ICD-10-CM | POA: Insufficient documentation

## 2016-04-06 NOTE — Progress Notes (Signed)
GENETIC TEST RESULT  HPI: Ms. Joyner was previously seen in the [place] clinic due to a family history of breast and other cancers and concerns regarding a hereditary predisposition to cancer. Please refer to our prior cancer genetics clinic note from November 28, 2015 for more information regarding Ms. Cerrito's medical, social and family histories, and our assessment and recommendations, at the time. Ms. Rudie recent genetic test results were disclosed to her, as were recommendations warranted by these results. These results and recommendations are discussed in more detail below.  GENETIC TEST RESULTS: At the time of Ms. Delman's visit on 11/28/15, we recommended she pursue genetic testing of the 30-gene Hereditary Cancer Panel through [facility]. The 30-gene Panel through [facility] [place], [place]) includes sequencing and/or deletion/duplication analysis of the following genes: APC, ATM, BAP1, BARD1, BMPR1A, BRCA1, BRCA2, BRIP1, CDH1, CDK4, CDKN2A, CHEK2, EPCAM, GREM1, MITF, MLH1, MSH2, MSH6, MUTYH, NBN, PALB2, PMS2, POLD1, POLE, PTEN, RAD51C, RAD51D, SMAD4, STK11, and TP53.  Those results are now back, the report date for which is December 27, 2015.  Genetic testing was normal, and did not reveal a deleterious mutation in these genes.  Additionally, no variants of uncertain significance (VUSes) were found.  The test report will be scanned into EPIC and will be located under the Results Review tab in the Pathology>Molecular Pathology section.   We discussed with Ms. Jackowski that since the current genetic testing is not perfect, it is possible there may be a gene mutation in one of these genes that current testing cannot detect, but that chance is small. We also discussed, that it is possible that another gene that has not yet been discovered, or that we have not yet tested, is responsible for the cancer diagnoses in the family, and it is, therefore, important to  remain in touch with cancer genetics in the future so that we can continue to offer Ms. Fayad the most up-to-date genetic testing.   CANCER SCREENING RECOMMENDATIONS: This normal result is reassuring and indicates that Ms. Cudney does not likely have an increased risk of cancer due to a mutation in one of these genes.  We, therefore, recommended  Ms. Suydam continue to follow the cancer screening guidelines provided by her primary healthcare providers.   RECOMMENDATIONS FOR FAMILY MEMBERS: Women in this family might be at some increased risk of developing cancer, over the general population risk, simply due to the family history of cancer. We recommended women in this family have a yearly mammogram beginning at age 81, or 60 years younger than the earliest onset of cancer, an annual clinical breast exam, and perform monthly breast self-exams. Women in this family should also have a gynecological exam as recommended by their primary provider. All family members should have a colonoscopy by age 7.  FOLLOW-UP: Lastly, we discussed with Ms. Huckeby that cancer genetics is a rapidly advancing field and it is possible that new genetic tests will be appropriate for her and/or her family members in the future. We encouraged her to remain in contact with cancer genetics on an annual basis so we can update her personal and family histories and let her know of advances in cancer genetics that may benefit this family.   Our contact number was provided. Ms. Diesing questions were answered to her satisfaction, and she knows she is welcome to call us at anytime with additional questions or concerns.   Jeanine Luz, MS, [facility] Certified Genetic Counselor [email or web address] Phone: [phone]

## 2016-05-06 ENCOUNTER — Ambulatory Visit
Admission: RE | Admit: 2016-05-06 | Discharge: 2016-05-06 | Disposition: A | Discharge status: Home / Self Care | Payer: 59 | Source: Ambulatory Visit | Attending: Family Medicine | Admitting: Family Medicine

## 2016-05-06 DIAGNOSIS — Z1231 Encounter for screening mammogram for malignant neoplasm of breast: Secondary | ICD-10-CM

## 2016-06-11 DIAGNOSIS — K602 Anal fissure, unspecified: Secondary | ICD-10-CM | POA: Diagnosis not present

## 2016-06-11 DIAGNOSIS — Z86010 Personal history of colonic polyps: Secondary | ICD-10-CM | POA: Diagnosis not present

## 2016-06-11 DIAGNOSIS — K219 Gastro-esophageal reflux disease without esophagitis: Secondary | ICD-10-CM | POA: Diagnosis not present

## 2016-07-21 ENCOUNTER — Other Ambulatory Visit: Payer: Self-pay | Admitting: Acute Care

## 2016-07-21 DIAGNOSIS — Z87891 Personal history of nicotine dependence: Secondary | ICD-10-CM

## 2016-07-24 ENCOUNTER — Ambulatory Visit (INDEPENDENT_AMBULATORY_CARE_PROVIDER_SITE_OTHER)
Admission: RE | Admit: 2016-07-24 | Discharge: 2016-07-24 | Disposition: A | Discharge status: Home / Self Care | Payer: 59 | Source: Ambulatory Visit | Attending: Acute Care | Admitting: Acute Care

## 2016-07-24 ENCOUNTER — Encounter: Payer: Self-pay | Admitting: Acute Care

## 2016-07-24 ENCOUNTER — Ambulatory Visit (INDEPENDENT_AMBULATORY_CARE_PROVIDER_SITE_OTHER): Payer: 59 | Admitting: Acute Care

## 2016-07-24 DIAGNOSIS — Z87891 Personal history of nicotine dependence: Secondary | ICD-10-CM | POA: Diagnosis not present

## 2016-07-24 NOTE — Progress Notes (Signed)
Shared Decision Making Visit Lung Cancer Screening Program [phone])   Eligibility:  Age 61 y.o.  Pack Years Smoking History Calculation 33-pack-year smoking history (# packs/per year x # years smoked)  Recent History of coughing up blood  no  Unexplained weight loss? no ( >Than 15 pounds within the last 6 months )  Prior History Lung / other cancer no (Diagnosis within the last 5 years already requiring surveillance chest CT Scans).  Smoking Status Former Smoker  Former Smokers: Years since quit: 7 years  Quit Date: 2011  Visit Components:  Discussion included one or more decision making aids. yes  Discussion included risk/benefits of screening. yes  Discussion included potential follow up diagnostic testing for abnormal scans. yes  Discussion included meaning and risk of over diagnosis. yes  Discussion included meaning and risk of False Positives. yes  Discussion included meaning of total radiation exposure. yes  Counseling Included:  Importance of adherence to annual lung cancer LDCT screening. yes  Impact of comorbidities on ability to participate in the program. yes  Ability and willingness to under diagnostic treatment. yes  Smoking Cessation Counseling:  Current Smokers:   Discussed importance of smoking cessation. yes  Information about tobacco cessation classes and interventions provided to patient. yes  Patient provided with "ticket" for LDCT Scan. yes  Symptomatic Patient. no  Counseling  Diagnosis Code: Tobacco Use Z72.0  Asymptomatic Patient yes  Counseling (Intermediate counseling: > three minutes counseling) [id number]  Former Smokers:   Discussed the importance of maintaining cigarette abstinence. yes  Diagnosis Code: Personal History of Nicotine Dependence. [id number]  Information about tobacco cessation classes and interventions provided to patient. Yes  Patient provided with "ticket" for LDCT Scan. yes  Written Order for Lung Cancer  Screening with LDCT placed in Epic. Yes (CT Chest Lung Cancer Screening Low Dose W/O CM) [id number] Z12.2-Screening of respiratory organs Z87.891-Personal history of nicotine dependence  I spent 25 minutes of face to face time with Karla [name] discussing the risks and benefits of lung cancer screening. We viewed a power point together that explained in detail the above noted topics. We took the time to pause the power point at intervals to allow for questions to be asked and answered to ensure understanding. We discussed that she had taken the single most powerful action possible to decrease her risk of developing lung cancer when she quit smoking. I counseled Karla [name] to remain smoke free, and to contact me if she ever had the desire to smoke again so that I can provide resources and tools to help support the effort to remain smoke free. We discussed the time and location of the scan, and that either [place] or I will call with the results within  24-48 hours of receiving them. Karla [name] has my card and contact information in the event she needs to speak with me, in addition to a copy of the power point we reviewed as a resource. Karla [name] verbalized understanding of all of the above and had no further questions upon leaving the office.   We discussed that there has been a high incidence of the incidental finding of coronary artery disease on these scans. I explained that this is a non-gated exam therefore degree or severity could not be determined. Karla [name] is currently being treated with a statin. She verbalized understanding of the above and had no further questions at completion of the appointment.  I spent between 3 and 4 minutes counseling patient to remain  smoke free.  Karla Spatz, NP 07/24/2016

## 2016-07-30 ENCOUNTER — Other Ambulatory Visit: Payer: Self-pay | Admitting: Acute Care

## 2016-07-30 DIAGNOSIS — Z87891 Personal history of nicotine dependence: Secondary | ICD-10-CM

## 2016-08-10 DIAGNOSIS — Z01411 Encounter for gynecological examination (general) (routine) with abnormal findings: Secondary | ICD-10-CM | POA: Diagnosis not present

## 2016-09-17 DIAGNOSIS — D225 Melanocytic nevi of trunk: Secondary | ICD-10-CM | POA: Diagnosis not present

## 2016-09-17 DIAGNOSIS — L814 Other melanin hyperpigmentation: Secondary | ICD-10-CM | POA: Diagnosis not present

## 2016-09-17 DIAGNOSIS — L400 Psoriasis vulgaris: Secondary | ICD-10-CM | POA: Diagnosis not present

## 2017-01-05 DIAGNOSIS — H5203 Hypermetropia, bilateral: Secondary | ICD-10-CM | POA: Diagnosis not present

## 2017-01-05 DIAGNOSIS — H524 Presbyopia: Secondary | ICD-10-CM | POA: Diagnosis not present

## 2017-01-06 DIAGNOSIS — E7800 Pure hypercholesterolemia, unspecified: Secondary | ICD-10-CM | POA: Diagnosis not present

## 2017-01-06 DIAGNOSIS — Z0000 Encounter for general adult medical examination without abnormal findings: Secondary | ICD-10-CM | POA: Diagnosis not present

## 2017-04-05 ENCOUNTER — Other Ambulatory Visit: Payer: Self-pay | Admitting: Family Medicine

## 2017-04-05 DIAGNOSIS — Z1231 Encounter for screening mammogram for malignant neoplasm of breast: Secondary | ICD-10-CM

## 2017-05-07 ENCOUNTER — Ambulatory Visit
Admission: RE | Admit: 2017-05-07 | Discharge: 2017-05-07 | Disposition: A | Discharge status: Home / Self Care | Payer: 59 | Source: Ambulatory Visit | Attending: Family Medicine | Admitting: Family Medicine

## 2017-05-07 DIAGNOSIS — Z1231 Encounter for screening mammogram for malignant neoplasm of breast: Secondary | ICD-10-CM

## 2017-07-26 ENCOUNTER — Ambulatory Visit (INDEPENDENT_AMBULATORY_CARE_PROVIDER_SITE_OTHER)
Admission: RE | Admit: 2017-07-26 | Discharge: 2017-07-26 | Disposition: A | Discharge status: Home / Self Care | Payer: 59 | Source: Ambulatory Visit | Attending: Acute Care | Admitting: Acute Care

## 2017-07-26 DIAGNOSIS — Z87891 Personal history of nicotine dependence: Secondary | ICD-10-CM | POA: Diagnosis not present

## 2017-07-30 ENCOUNTER — Telehealth: Payer: Self-pay | Admitting: Acute Care

## 2017-07-30 DIAGNOSIS — Z87891 Personal history of nicotine dependence: Secondary | ICD-10-CM

## 2017-07-30 DIAGNOSIS — Z122 Encounter for screening for malignant neoplasm of respiratory organs: Secondary | ICD-10-CM

## 2017-07-30 NOTE — Telephone Encounter (Signed)
Pt informed of CT results per Sarah Groce, NP.  PT verbalized understanding.  Copy sent to PCP.  Order placed for 1 yr f/u CT.  

## 2017-08-12 DIAGNOSIS — Z01411 Encounter for gynecological examination (general) (routine) with abnormal findings: Secondary | ICD-10-CM | POA: Diagnosis not present

## 2017-09-21 DIAGNOSIS — L400 Psoriasis vulgaris: Secondary | ICD-10-CM | POA: Diagnosis not present

## 2017-09-21 DIAGNOSIS — Z79899 Other long term (current) drug therapy: Secondary | ICD-10-CM | POA: Diagnosis not present

## 2017-09-21 DIAGNOSIS — D225 Melanocytic nevi of trunk: Secondary | ICD-10-CM | POA: Diagnosis not present

## 2017-09-21 DIAGNOSIS — D692 Other nonthrombocytopenic purpura: Secondary | ICD-10-CM | POA: Diagnosis not present

## 2017-09-21 DIAGNOSIS — D224 Melanocytic nevi of scalp and neck: Secondary | ICD-10-CM | POA: Diagnosis not present

## 2017-10-14 DIAGNOSIS — T1511XA Foreign body in conjunctival sac, right eye, initial encounter: Secondary | ICD-10-CM | POA: Diagnosis not present

## 2017-10-30 ENCOUNTER — Encounter (HOSPITAL_COMMUNITY): Payer: Self-pay | Admitting: Oncology

## 2018-01-25 DIAGNOSIS — Z86010 Personal history of colonic polyps: Secondary | ICD-10-CM | POA: Diagnosis not present

## 2018-01-25 DIAGNOSIS — Z1211 Encounter for screening for malignant neoplasm of colon: Secondary | ICD-10-CM | POA: Diagnosis not present

## 2018-01-25 DIAGNOSIS — K219 Gastro-esophageal reflux disease without esophagitis: Secondary | ICD-10-CM | POA: Diagnosis not present

## 2018-01-31 DIAGNOSIS — E7800 Pure hypercholesterolemia, unspecified: Secondary | ICD-10-CM | POA: Diagnosis not present

## 2018-01-31 DIAGNOSIS — Z23 Encounter for immunization: Secondary | ICD-10-CM | POA: Diagnosis not present

## 2018-01-31 DIAGNOSIS — Z0000 Encounter for general adult medical examination without abnormal findings: Secondary | ICD-10-CM | POA: Diagnosis not present

## 2018-03-04 DIAGNOSIS — K635 Polyp of colon: Secondary | ICD-10-CM | POA: Diagnosis not present

## 2018-03-04 DIAGNOSIS — Z1211 Encounter for screening for malignant neoplasm of colon: Secondary | ICD-10-CM | POA: Diagnosis not present

## 2018-03-04 DIAGNOSIS — D122 Benign neoplasm of ascending colon: Secondary | ICD-10-CM | POA: Diagnosis not present

## 2018-03-29 ENCOUNTER — Other Ambulatory Visit: Payer: Self-pay | Admitting: Family Medicine

## 2018-03-29 DIAGNOSIS — Z1231 Encounter for screening mammogram for malignant neoplasm of breast: Secondary | ICD-10-CM

## 2018-05-13 ENCOUNTER — Ambulatory Visit
Admission: RE | Admit: 2018-05-13 | Discharge: 2018-05-13 | Disposition: A | Discharge status: Home / Self Care | Payer: 59 | Source: Ambulatory Visit | Attending: Family Medicine | Admitting: Family Medicine

## 2018-05-13 DIAGNOSIS — Z1231 Encounter for screening mammogram for malignant neoplasm of breast: Secondary | ICD-10-CM | POA: Diagnosis not present

## 2018-07-29 ENCOUNTER — Ambulatory Visit (INDEPENDENT_AMBULATORY_CARE_PROVIDER_SITE_OTHER)
Admission: RE | Admit: 2018-07-29 | Discharge: 2018-07-29 | Disposition: A | Discharge status: Home / Self Care | Payer: 59 | Source: Ambulatory Visit | Attending: Acute Care | Admitting: Acute Care

## 2018-07-29 ENCOUNTER — Inpatient Hospital Stay: Admission: RE | Admit: 2018-07-29 | Payer: 59 | Source: Ambulatory Visit

## 2018-07-29 DIAGNOSIS — Z87891 Personal history of nicotine dependence: Secondary | ICD-10-CM | POA: Diagnosis not present

## 2018-07-29 DIAGNOSIS — Z122 Encounter for screening for malignant neoplasm of respiratory organs: Secondary | ICD-10-CM

## 2018-08-03 ENCOUNTER — Telehealth: Payer: Self-pay | Admitting: Acute Care

## 2018-08-03 DIAGNOSIS — Z122 Encounter for screening for malignant neoplasm of respiratory organs: Secondary | ICD-10-CM

## 2018-08-03 DIAGNOSIS — Z87891 Personal history of nicotine dependence: Secondary | ICD-10-CM

## 2018-08-03 NOTE — Telephone Encounter (Signed)
Pt informed of CT results per Sarah Groce, NP.  PT verbalized understanding.  Copy sent to PCP.  Order placed for 1 yr f/u CT.  

## 2018-08-18 DIAGNOSIS — Z01411 Encounter for gynecological examination (general) (routine) with abnormal findings: Secondary | ICD-10-CM | POA: Diagnosis not present

## 2018-08-25 DIAGNOSIS — H9313 Tinnitus, bilateral: Secondary | ICD-10-CM | POA: Diagnosis not present

## 2018-08-25 DIAGNOSIS — H903 Sensorineural hearing loss, bilateral: Secondary | ICD-10-CM | POA: Diagnosis not present

## 2018-10-25 DIAGNOSIS — L603 Nail dystrophy: Secondary | ICD-10-CM | POA: Diagnosis not present

## 2018-10-25 DIAGNOSIS — L814 Other melanin hyperpigmentation: Secondary | ICD-10-CM | POA: Diagnosis not present

## 2018-10-25 DIAGNOSIS — L570 Actinic keratosis: Secondary | ICD-10-CM | POA: Diagnosis not present

## 2018-10-25 DIAGNOSIS — Z79899 Other long term (current) drug therapy: Secondary | ICD-10-CM | POA: Diagnosis not present

## 2018-10-25 DIAGNOSIS — L400 Psoriasis vulgaris: Secondary | ICD-10-CM | POA: Diagnosis not present

## 2019-03-16 ENCOUNTER — Other Ambulatory Visit: Payer: Self-pay

## 2019-03-16 DIAGNOSIS — Z20822 Contact with and (suspected) exposure to covid-19: Secondary | ICD-10-CM

## 2019-03-17 LAB — NOVEL CORONAVIRUS, NAA: SARS-CoV-2, NAA: NOT DETECTED

## 2019-04-03 ENCOUNTER — Other Ambulatory Visit: Payer: Self-pay | Admitting: Family Medicine

## 2019-04-03 DIAGNOSIS — Z1231 Encounter for screening mammogram for malignant neoplasm of breast: Secondary | ICD-10-CM

## 2019-05-19 ENCOUNTER — Ambulatory Visit
Admission: RE | Admit: 2019-05-19 | Discharge: 2019-05-19 | Disposition: A | Discharge status: Home / Self Care | Payer: 59 | Source: Ambulatory Visit | Attending: Family Medicine | Admitting: Family Medicine

## 2019-05-19 ENCOUNTER — Other Ambulatory Visit: Payer: Self-pay

## 2019-05-19 DIAGNOSIS — Z1231 Encounter for screening mammogram for malignant neoplasm of breast: Secondary | ICD-10-CM

## 2019-08-21 ENCOUNTER — Ambulatory Visit: Admission: RE | Admit: 2019-08-21 | Payer: 59 | Source: Ambulatory Visit

## 2019-10-02 ENCOUNTER — Ambulatory Visit: Payer: 59

## 2020-03-05 ENCOUNTER — Other Ambulatory Visit: Payer: Self-pay | Admitting: Family Medicine

## 2020-03-05 DIAGNOSIS — N644 Mastodynia: Secondary | ICD-10-CM

## 2020-03-21 ENCOUNTER — Other Ambulatory Visit: Payer: Self-pay

## 2020-03-21 ENCOUNTER — Ambulatory Visit
Admission: RE | Admit: 2020-03-21 | Discharge: 2020-03-21 | Disposition: A | Discharge status: Home / Self Care | Payer: 59 | Source: Ambulatory Visit | Attending: Family Medicine | Admitting: Family Medicine

## 2020-03-21 DIAGNOSIS — N644 Mastodynia: Secondary | ICD-10-CM

## 2020-04-16 ENCOUNTER — Other Ambulatory Visit: Payer: Self-pay | Admitting: Family Medicine

## 2020-04-16 DIAGNOSIS — Z1231 Encounter for screening mammogram for malignant neoplasm of breast: Secondary | ICD-10-CM

## 2020-05-24 ENCOUNTER — Ambulatory Visit
Admission: RE | Admit: 2020-05-24 | Discharge: 2020-05-24 | Disposition: A | Discharge status: Home / Self Care | Payer: 59 | Source: Ambulatory Visit | Attending: Family Medicine | Admitting: Family Medicine

## 2020-05-24 ENCOUNTER — Other Ambulatory Visit: Payer: Self-pay

## 2020-05-24 DIAGNOSIS — Z1231 Encounter for screening mammogram for malignant neoplasm of breast: Secondary | ICD-10-CM

## 2021-03-19 ENCOUNTER — Other Ambulatory Visit: Payer: Self-pay | Admitting: *Deleted

## 2021-03-19 DIAGNOSIS — Z87891 Personal history of nicotine dependence: Secondary | ICD-10-CM

## 2021-03-24 ENCOUNTER — Ambulatory Visit
Admission: RE | Admit: 2021-03-24 | Discharge: 2021-03-24 | Disposition: A | Discharge status: Home / Self Care | Payer: 59 | Source: Ambulatory Visit | Attending: Acute Care | Admitting: Acute Care

## 2021-03-24 ENCOUNTER — Other Ambulatory Visit: Payer: Self-pay

## 2021-03-24 DIAGNOSIS — Z87891 Personal history of nicotine dependence: Secondary | ICD-10-CM | POA: Insufficient documentation

## 2021-03-27 ENCOUNTER — Other Ambulatory Visit: Payer: Self-pay | Admitting: Acute Care

## 2021-03-27 DIAGNOSIS — Z87891 Personal history of nicotine dependence: Secondary | ICD-10-CM

## 2021-04-14 ENCOUNTER — Other Ambulatory Visit: Payer: Self-pay | Admitting: Internal Medicine

## 2021-04-14 DIAGNOSIS — Z1231 Encounter for screening mammogram for malignant neoplasm of breast: Secondary | ICD-10-CM

## 2021-05-26 ENCOUNTER — Ambulatory Visit
Admission: RE | Admit: 2021-05-26 | Discharge: 2021-05-26 | Disposition: A | Discharge status: Home / Self Care | Payer: 59 | Source: Ambulatory Visit | Attending: Internal Medicine | Admitting: Internal Medicine

## 2021-05-26 DIAGNOSIS — Z1231 Encounter for screening mammogram for malignant neoplasm of breast: Secondary | ICD-10-CM

## 2022-03-24 ENCOUNTER — Ambulatory Visit
Admission: RE | Admit: 2022-03-24 | Discharge: 2022-03-24 | Disposition: A | Discharge status: Home / Self Care | Payer: 59 | Source: Ambulatory Visit | Attending: Acute Care | Admitting: Acute Care

## 2022-03-24 DIAGNOSIS — I700 Atherosclerosis of aorta: Secondary | ICD-10-CM | POA: Insufficient documentation

## 2022-03-24 DIAGNOSIS — Z87891 Personal history of nicotine dependence: Secondary | ICD-10-CM | POA: Insufficient documentation

## 2022-03-24 DIAGNOSIS — I2510 Atherosclerotic heart disease of native coronary artery without angina pectoris: Secondary | ICD-10-CM | POA: Diagnosis not present

## 2022-03-24 DIAGNOSIS — Z122 Encounter for screening for malignant neoplasm of respiratory organs: Secondary | ICD-10-CM | POA: Insufficient documentation

## 2022-03-24 DIAGNOSIS — J439 Emphysema, unspecified: Secondary | ICD-10-CM | POA: Diagnosis not present

## 2022-03-26 ENCOUNTER — Other Ambulatory Visit: Payer: Self-pay

## 2022-03-26 DIAGNOSIS — Z87891 Personal history of nicotine dependence: Secondary | ICD-10-CM

## 2022-03-26 DIAGNOSIS — Z122 Encounter for screening for malignant neoplasm of respiratory organs: Secondary | ICD-10-CM

## 2022-04-14 ENCOUNTER — Other Ambulatory Visit: Payer: Self-pay | Admitting: Internal Medicine

## 2022-04-14 DIAGNOSIS — Z1231 Encounter for screening mammogram for malignant neoplasm of breast: Secondary | ICD-10-CM

## 2022-05-28 ENCOUNTER — Ambulatory Visit
Admission: RE | Admit: 2022-05-28 | Discharge: 2022-05-28 | Disposition: A | Discharge status: Home / Self Care | Payer: 59 | Source: Ambulatory Visit | Attending: Internal Medicine | Admitting: Internal Medicine

## 2022-05-28 DIAGNOSIS — Z1231 Encounter for screening mammogram for malignant neoplasm of breast: Secondary | ICD-10-CM

## 2022-09-10 ENCOUNTER — Ambulatory Visit
Admission: RE | Admit: 2022-09-10 | Discharge: 2022-09-10 | Disposition: A | Discharge status: Home / Self Care | Payer: Medicare Other | Source: Ambulatory Visit | Attending: Physician Assistant | Admitting: Physician Assistant

## 2022-09-10 VITALS — BP 142/78 | HR 57 | Temp 97.9°F | Resp 18 | Ht 65.0 in | Wt 133.0 lb

## 2022-09-10 DIAGNOSIS — R300 Dysuria: Secondary | ICD-10-CM

## 2022-09-10 DIAGNOSIS — N898 Other specified noninflammatory disorders of vagina: Secondary | ICD-10-CM | POA: Diagnosis not present

## 2022-09-10 DIAGNOSIS — N3001 Acute cystitis with hematuria: Secondary | ICD-10-CM

## 2022-09-10 HISTORY — DX: Psoriasis, unspecified: L40.9

## 2022-09-10 LAB — POCT URINALYSIS DIP (MANUAL ENTRY)
Bilirubin, UA: NEGATIVE
Glucose, UA: NEGATIVE mg/dL
Ketones, POC UA: NEGATIVE mg/dL
Nitrite, UA: NEGATIVE
Spec Grav, UA: <=1.005 — AB (ref 1.010–1.025)
Urobilinogen, UA: 0.2 E.U./dL
pH, UA: 5.5 (ref 5.0–8.0)

## 2022-09-10 MED ORDER — CEPHALEXIN 500 MG PO CAPS: 500.0000 mg | ORAL_CAPSULE | Freq: Two times a day (BID) | ORAL | 0 refills | Status: AC

## 2022-09-10 MED ORDER — TERCONAZOLE 0.4 % VA CREA: 1.0000 | TOPICAL_CREAM | Freq: Every day | VAGINAL | 0 refills | Status: AC

## 2022-09-10 NOTE — Discharge Instructions (Signed)
We are treating you for a urinary tract infection and yeast infection.  Take Keflex 500 mg twice daily for 7 days.  Use terconazole cream at night to treat the yeast infection.  Wear loosefitting cotton underwear and use hypoallergenic soaps and detergents.  We are sending this for culture and also sent off your swab.  If we need to arrange any additional treatment we will contact you.  Make sure that you rest and drink plenty of fluid.  If your symptoms do not improve or if anything worsens and you have abdominal pain, fever, nausea, vomiting, pelvic pain you should be seen immediately.

## 2022-09-10 NOTE — ED Triage Notes (Signed)
Patient to Urgent Care with complaints of dysuria, vaginal itching, cloudy urine. Reports she has also been leaking some urine.   Symptoms started one week ago. Denies any known fevers.   Reports when she was collecting a vaginal swab she noticed some bleeding.

## 2022-09-10 NOTE — ED Provider Notes (Signed)
[facility]    CSN: [id number] Arrival date & time: 09/10/22  0931      History   Chief Complaint Chief Complaint  Patient presents with   Urinary Frequency    UTI - itching, urine leak, cloudy urine - Entered by patient    HPI Karla [name] is a 67 y.o. female.   Patient presents today with a weeklong history of UTI symptoms.  She reports dysuria, vaginal itching, vaginal discharge, cloudy urine, urinary frequency, new onset urinary incontinence.  She reports that symptoms began when she was hiking and carrying her dog in a backpack.  She initially attributed urinary incontinence during this activity to the stress of caring her dog.  She then soon developed additional symptoms including dysuria and frequency.  She also reports some vaginal irritation as well as thick white discharge.  Denies any associated odor.  Denies any recent antibiotics.  She has not tried any over-the-counter medication for symptom management.  Denies history of recurrent urinary tract infections.  Denies any recent urogenital procedure or self-catheterization.  Denies associated fever, nausea, vomiting, pelvic pain.  She is status post hysterectomy.    Past Medical History:  Diagnosis Date   Psoriasis     Patient Active Problem List   Diagnosis Date Noted   Genetic testing 04/06/2016   Family history of breast cancer in female 11/28/2015    Past Surgical History:  Procedure Laterality Date   ABDOMINAL HYSTERECTOMY     ARTHROSCOPIC REPAIR ACL Left    RHINOPLASTY      OB History   No obstetric history on file.      Home Medications    Prior to Admission medications   Medication Sig Start Date End Date Taking? Authorizing Provider  cephALEXin (KEFLEX) 500 MG capsule Take 1 capsule (500 mg total) by mouth 2 (two) times daily. 09/10/22  Yes Huxley Shurley K, PA-C  montelukast (SINGULAIR) 10 MG tablet Take by mouth. 06/21/15  Yes [provider]  pravastatin (PRAVACHOL) 40  MG tablet Take by mouth. 06/21/15  Yes [provider]  STELARA 45 MG/0.5ML SOSY injection Inject into the skin. 04/14/22  Yes [provider]  terconazole (TERAZOL 7) 0.4 % vaginal cream Place 1 applicator vaginally at bedtime. 09/10/22  Yes Javaeh Muscatello K, PA-C  omeprazole (PRILOSEC) 20 MG capsule Take 20 mg by mouth daily.    [provider]  valACYclovir (VALTREX) 500 MG tablet Take 500 mg by mouth daily. Patient not taking: Reported on 09/10/2022 05/25/22   [provider]    Family History Family History  Problem Relation Age of Onset   Breast cancer Mother 3       w/ recurrence (x2); s/p mastectomy   Brain cancer Maternal Uncle        dx. late 42s; NOS type   Kidney cancer Paternal Aunt        NOS type; dx. 54s; former smoker   Breast cancer Maternal Grandmother 30   Lung cancer Maternal Grandfather 16       former smoker   Cancer Paternal Grandfather        possible brain cancer; dx. 75s   Cancer Other        maternal great aunt (MGF's sister) dx NOS cancer at later age   46 Other        maternal great uncle (MGF's brother) dx NOS cancer at later age   Lung cancer Other        paternal  great uncle (PGM's brother) d. 45s; +smoker    Social History Social History   Tobacco Use   Smoking status: Former    Packs/day: 1.00    Years: 35.00    Additional pack years: 0.00    Total pack years: 35.00    Types: Cigarettes    Quit date: 06/08/2009    Years since quitting: 13.2   Smokeless tobacco: Never     Allergies   Sulfa antibiotics   Review of Systems Review of Systems  Constitutional:  Positive for activity change. Negative for appetite change, fatigue and fever.  Gastrointestinal:  Negative for abdominal pain, diarrhea, nausea and vomiting.  Genitourinary:  Positive for dysuria, frequency, urgency, vaginal discharge and vaginal pain. Negative for pelvic pain and vaginal bleeding.     Physical Exam Triage Vital Signs ED  Triage Vitals  Enc Vitals Group     BP 09/10/22 0946 (!) 142/78     Pulse Rate 09/10/22 0946 (!) 57     Resp 09/10/22 0946 18     Temp 09/10/22 0946 97.9 F (36.6 C)     Temp src --      SpO2 09/10/22 0946 98 %     Weight 09/10/22 0944 133 lb (60.3 kg)     Height 09/10/22 0944 5\' 5"  (1.651 m)     Head Circumference --      Peak Flow --      Pain Score 09/10/22 0942 0     Pain Loc --      Pain Edu? --      Excl. in [place]? --    No data found.  Updated Vital Signs BP (!) 142/78   Pulse (!) 57   Temp 97.9 F (36.6 C)   Resp 18   Ht 5\' 5"  (1.651 m)   Wt 133 lb (60.3 kg)   SpO2 98%   BMI 22.13 kg/m   Visual Acuity Right Eye Distance:   Left Eye Distance:   Bilateral Distance:    Right Eye Near:   Left Eye Near:    Bilateral Near:     Physical Exam Vitals reviewed.  Constitutional:      General: She is awake. She is not in acute distress.    Appearance: Normal appearance. She is well-developed. She is not ill-appearing.     Comments: Very pleasant female appears stated age in no acute distress sitting comfortably in exam room  HENT:     Head: Normocephalic and atraumatic.  Cardiovascular:     Rate and Rhythm: Normal rate and regular rhythm.     Heart sounds: Normal heart sounds, S1 normal and S2 normal. No murmur heard. Pulmonary:     Effort: Pulmonary effort is normal.     Breath sounds: Normal breath sounds. No wheezing, rhonchi or rales.     Comments: Clear to auscultation bilaterally Abdominal:     General: Bowel sounds are normal.     Palpations: Abdomen is soft.     Tenderness: There is no abdominal tenderness. There is no right CVA tenderness, left CVA tenderness, guarding or rebound.     Comments: Benign abdominal exam  Psychiatric:        Behavior: Behavior is cooperative.      UC Treatments / Results  Labs (all labs ordered are listed, but only abnormal results are displayed) Labs Reviewed  POCT URINALYSIS DIP (MANUAL ENTRY) - Abnormal; Notable  for the following components:      Result Value   Spec  Grav, UA <=1.005 (*)    Blood, UA large (*)    Protein Ur, POC trace (*)    Leukocytes, UA Small (1+) (*)    All other components within normal limits  URINE CULTURE  CERVICOVAGINAL ANCILLARY ONLY    EKG   Radiology No results found.  Procedures Procedures (including critical care time)  Medications Ordered in UC Medications - No data to display  Initial Impression / Assessment and Plan / UC Course  I have reviewed the triage vital signs and the nursing notes.  Pertinent labs & imaging results that were available during my care of the patient were reviewed by me and considered in my medical decision making (see chart for details).     Patient is well-appearing, afebrile, nontoxic, nontachycardic.  UA did show hematuria as well as leukocyte esterase will cover for UTI given clinical presentation.  Will obtain urine culture and we discussed potential need to change antibiotics or discontinue them based on culture results.  She was started on Keflex 500 mg twice daily for 7 days.  Given her associated discharge and vaginal irritation will also cover for vaginal yeast infection with terconazole.  Prescription was sent to the pharmacy.  Recommended that she use hypoallergenic soaps and detergents and wear loosefitting cotton underwear.  Swab was collected and we will contact her if need to arrange additional treatment.  Discussed that if she develops any abdominal pain, pelvic pain, fever, nausea, vomiting she should be seen immediately.  Strict return precautions given.  Final Clinical Impressions(s) / UC Diagnoses   Final diagnoses:  Dysuria  Vaginal discharge  Acute cystitis with hematuria     Discharge Instructions      We are treating you for a urinary tract infection and yeast infection.  Take Keflex 500 mg twice daily for 7 days.  Use terconazole cream at night to treat the yeast infection.  Wear loosefitting cotton  underwear and use hypoallergenic soaps and detergents.  We are sending this for culture and also sent off your swab.  If we need to arrange any additional treatment we will contact you.  Make sure that you rest and drink plenty of fluid.  If your symptoms do not improve or if anything worsens and you have abdominal pain, fever, nausea, vomiting, pelvic pain you should be seen immediately.     ED Prescriptions     Medication Sig Dispense Auth. Provider   cephALEXin (KEFLEX) 500 MG capsule Take 1 capsule (500 mg total) by mouth 2 (two) times daily. 14 capsule Tailor Lucking K, PA-C   terconazole (TERAZOL 7) 0.4 % vaginal cream Place 1 applicator vaginally at bedtime. 45 g [place] Langstaff K, PA-C      PDMP not reviewed this encounter.   Terrilee Croak, PA-C 09/10/22 1003

## 2022-09-11 LAB — URINE CULTURE: Culture: NO GROWTH

## 2022-09-14 LAB — CERVICOVAGINAL ANCILLARY ONLY
Bacterial Vaginitis (gardnerella): NEGATIVE
Candida Glabrata: NEGATIVE
Candida Vaginitis: NEGATIVE
Chlamydia: NEGATIVE
Comment: NEGATIVE
Comment: NEGATIVE
Comment: NEGATIVE
Comment: NEGATIVE
Comment: NEGATIVE
Comment: NORMAL
Neisseria Gonorrhea: NEGATIVE
Trichomonas: NEGATIVE

## 2022-10-06 ENCOUNTER — Other Ambulatory Visit: Payer: Self-pay | Admitting: Obstetrics and Gynecology

## 2022-10-06 DIAGNOSIS — Z13820 Encounter for screening for osteoporosis: Secondary | ICD-10-CM

## 2023-03-26 ENCOUNTER — Ambulatory Visit
Admission: RE | Admit: 2023-03-26 | Discharge: 2023-03-26 | Disposition: A | Discharge status: Home / Self Care | Payer: Medicare Other | Source: Ambulatory Visit | Attending: Internal Medicine | Admitting: Internal Medicine

## 2023-03-26 DIAGNOSIS — Z122 Encounter for screening for malignant neoplasm of respiratory organs: Secondary | ICD-10-CM | POA: Insufficient documentation

## 2023-03-26 DIAGNOSIS — Z87891 Personal history of nicotine dependence: Secondary | ICD-10-CM | POA: Insufficient documentation

## 2023-04-13 ENCOUNTER — Other Ambulatory Visit: Payer: Self-pay | Admitting: Acute Care

## 2023-04-13 DIAGNOSIS — Z87891 Personal history of nicotine dependence: Secondary | ICD-10-CM

## 2023-04-13 DIAGNOSIS — Z122 Encounter for screening for malignant neoplasm of respiratory organs: Secondary | ICD-10-CM

## 2023-04-22 ENCOUNTER — Inpatient Hospital Stay
Admission: RE | Admit: 2023-04-22 | Discharge: 2023-04-22 | Disposition: A | Discharge status: Home / Self Care | Payer: Medicare Other | Source: Ambulatory Visit | Attending: Obstetrics and Gynecology

## 2023-04-22 DIAGNOSIS — Z13820 Encounter for screening for osteoporosis: Secondary | ICD-10-CM

## 2023-04-27 ENCOUNTER — Other Ambulatory Visit: Payer: Self-pay | Admitting: Internal Medicine

## 2023-04-27 DIAGNOSIS — Z1231 Encounter for screening mammogram for malignant neoplasm of breast: Secondary | ICD-10-CM

## 2023-06-16 ENCOUNTER — Ambulatory Visit
Admission: RE | Admit: 2023-06-16 | Discharge: 2023-06-16 | Disposition: A | Discharge status: Home / Self Care | Payer: Medicare Other | Source: Ambulatory Visit | Attending: Internal Medicine | Admitting: Internal Medicine

## 2023-06-16 DIAGNOSIS — Z1231 Encounter for screening mammogram for malignant neoplasm of breast: Secondary | ICD-10-CM

## 2024-03-27 ENCOUNTER — Ambulatory Visit
Admission: RE | Admit: 2024-03-27 | Discharge: 2024-03-27 | Disposition: A | Discharge status: Home / Self Care | Source: Ambulatory Visit | Attending: Internal Medicine | Admitting: Internal Medicine

## 2024-03-27 DIAGNOSIS — Z122 Encounter for screening for malignant neoplasm of respiratory organs: Secondary | ICD-10-CM | POA: Insufficient documentation

## 2024-03-27 DIAGNOSIS — Z87891 Personal history of nicotine dependence: Secondary | ICD-10-CM | POA: Insufficient documentation

## 2024-03-30 ENCOUNTER — Encounter (INDEPENDENT_AMBULATORY_CARE_PROVIDER_SITE_OTHER): Payer: Self-pay

## 2024-03-30 ENCOUNTER — Other Ambulatory Visit: Payer: Self-pay

## 2024-03-30 DIAGNOSIS — Z87891 Personal history of nicotine dependence: Secondary | ICD-10-CM

## 2024-03-30 DIAGNOSIS — F17210 Nicotine dependence, cigarettes, uncomplicated: Secondary | ICD-10-CM

## 2024-03-30 DIAGNOSIS — Z122 Encounter for screening for malignant neoplasm of respiratory organs: Secondary | ICD-10-CM

## 2024-05-02 ENCOUNTER — Other Ambulatory Visit: Payer: Self-pay | Admitting: Internal Medicine

## 2024-05-02 DIAGNOSIS — Z1231 Encounter for screening mammogram for malignant neoplasm of breast: Secondary | ICD-10-CM

## 2024-05-15 ENCOUNTER — Encounter (INDEPENDENT_AMBULATORY_CARE_PROVIDER_SITE_OTHER): Payer: Self-pay

## 2024-05-15 ENCOUNTER — Ambulatory Visit (INDEPENDENT_AMBULATORY_CARE_PROVIDER_SITE_OTHER)

## 2024-05-15 VITALS — BP 138/72 | HR 61 | Temp 97.6°F | Ht 65.0 in | Wt 130.0 lb

## 2024-05-15 DIAGNOSIS — J342 Deviated nasal septum: Secondary | ICD-10-CM

## 2024-05-15 DIAGNOSIS — H9313 Tinnitus, bilateral: Secondary | ICD-10-CM

## 2024-05-15 DIAGNOSIS — J348200 Internal nasal valve collapse, unspecified: Secondary | ICD-10-CM

## 2024-05-15 DIAGNOSIS — J3489 Other specified disorders of nose and nasal sinuses: Secondary | ICD-10-CM

## 2024-05-15 NOTE — Progress Notes (Signed)
 Dear Dr. Vernon, Here is my assessment for our mutual patient, Karla [name]. Thank you for allowing me the opportunity to care for your patient. Please do not hesitate to contact me should you have any other questions. Sincerely, Dr. Hadassah Parody  Otolaryngology Clinic Note Referring provider: Dr. Vernon HPI:   Initial HPI (05/15/2024) Karla [name] is a 68 y.o. female kindly referred by Dr. Vernon for evaluation of nasal septal deviation, nasal obstruction and nasal dryness and tinnitus.   Reports chronic bilateral tinnitus. Previously evaluated by ENT 3-4 years ago. Wondering if we have any new treatment options. Has not worsened and has learned to live with it. Not interested in hearing aids.   Also c/o nasal dryness, nasal obstruction  - history of open rhinoplasty in 1993 with septoplasty  - left side more affected  - progressively worsening dryness in last 10-15 years  - Current regimen includes saline spray, Flonase, and occasional application of petroleum jelly with a Q-tip - ayr gel doesn't help because it gets cakey    Independent Review of Additional Tests or Records:  Referral note Velna Vernon, MD (03/30/24): bilateral tinnitus and deviated nasal septum.    PMH/Meds/All/SocHx/FamHx/ROS:   Past Medical History:  Diagnosis Date   Psoriasis      Past Surgical History:  Procedure Laterality Date   ABDOMINAL HYSTERECTOMY     ARTHROSCOPIC REPAIR ACL Left    RHINOPLASTY      Family History  Problem Relation Age of Onset   Breast cancer Mother 65       w/ recurrence (x2); s/p mastectomy   Brain cancer Maternal Uncle        dx. late 30s; NOS type   Kidney cancer Paternal Aunt        NOS type; dx. 77s; former smoker   Breast cancer Maternal Grandmother 74   Lung cancer Maternal Grandfather 24       former smoker   Cancer Paternal Grandfather        possible brain cancer; dx. 90s   Cancer Other        maternal great aunt (MGF's sister) dx NOS cancer at  later age   Cancer Other        maternal great uncle (MGF's brother) dx NOS cancer at later age   Lung cancer Other        paternal great uncle (PGM's brother) d. 18s; +smoker     Social Connections: Not on file     Current Outpatient Medications  Medication Instructions   amLODipine (NORVASC) 5 mg, Daily   cephALEXin  (KEFLEX ) 500 mg, Oral, 2 times daily   montelukast (SINGULAIR) 10 MG tablet Take by mouth.   omeprazole (PRILOSEC) 20 mg, Daily   pravastatin (PRAVACHOL) 40 MG tablet Take by mouth.   STELARA 45 MG/0.5ML SOSY injection Inject into the skin.   terconazole  (TERAZOL 7 ) 0.4 % vaginal cream 1 applicator, Vaginal, Daily at bedtime   valACYclovir (VALTREX) 500 mg, Daily     Physical Exam:   BP 138/72   Pulse 61   Temp 97.6 F (36.4 C)   Ht 5' 5 (1.651 m)   Wt 130 lb (59 kg)   SpO2 97%   BMI 21.63 kg/m   Salient findings:  CN II-XII intact Bilateral EAC clear and TM intact with well pneumatized middle ear spaces Anterior rhinoscopy: Septum lefward deviation anteriorly; bilateral inferior turbinates with hypertrophy. Very narrow left nasal vault Modified cottle positive bilaterally  Thin nasal skin Over-rotated nasal tip  Alar retraction bilaterally  No lesions of oral cavity/oropharynx; dentition fair No obviously palpable neck masses/lymphadenopathy/thyromegaly No respiratory distress or stridor  Seprately Identifiable Procedures:  Prior to initiating any procedures, risks/benefits/alternatives were explained to the patient and verbal consent obtained.  PROCEDURE (05/15/2024): Bilateral Diagnostic Rigid Nasal Endoscopy Pre-procedure diagnosis: Concern for nasal obstruction, nasal scabbing  Post-procedure diagnosis: same Indication: See pre-procedure diagnosis and physical exam above Complications: None apparent EBL: 0 mL Anesthesia: Lidocaine 4% and topical decongestant was topically sprayed in each nasal cavity  Description of Procedure:  Patient was  identified. A rigid 30 degree endoscope was utilized to evaluate the sinonasal cavities, mucosa, sinus ostia and turbinates and septum.  Overall, signs of mucosal inflammation are noted.  Also noted are significant mucosal dryness, very narrow left nasal cavity, leftward nasal septal deviation.  No mucopurulence, polyps, or masses noted.   Right Middle meatus: clear Right SE Recess: clear Left MM: clear Left SE Recess: clear Photodocumentation was obtained.  CPT CODE -- 68768 - Mod 25   Impression & Plans:  Karla [name] is a 68 y.o. female with   1. Nasal septal deviation   2. Collapse of internal nasal valve   3. Nasal obstruction   4. 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 Examination reveals a deviated septum anteriorly with internal nasal valve collapse.  Petroleum jelly is not ideal due to its non-water-based nature. Ayr is used but becomes cakey. Revision rhinoplasty may be necessary to address structural issues, including septum straightening and internal nasal valve collapse. Referral to a specialist at [facility] is recommended for evaluation for possible revision. She is interested in this.  - Recommended trialing Ponaris for nasal dryness, available on [employer]. - Referred to [facility] in [place] for evaluation and potential revision rhinoplasty if they feel appropriate   Tinnitus Chronic tinnitus for approximately 20 years with previous ENT evaluation showing poor high-frequency hearing. No significant worsening. Ok with continuing to monitor. Not interested in repeat hearing eval or hearing aids  - Continue monitoring    See below regarding exact medications prescribed this encounter including dosages and route: No orders of the defined  types were placed in this encounter.   Thank you for allowing me the opportunity to care for your patient. Please do not hesitate to contact me should you have any other questions.  Sincerely, Hadassah Parody, MD Otolaryngologist (ENT), [facility] Phone: [phone] Fax: [phone]  MDM:  Level 3 Complexity/Problems addressed: 4- 2 chronic problems, one of which is worsening  Data complexity: 2 independent review of one note  - Morbidity: 3 - OTC drugs  - Prescription Drug prescribed or managed: no

## 2024-05-16 ENCOUNTER — Encounter (INDEPENDENT_AMBULATORY_CARE_PROVIDER_SITE_OTHER): Payer: Self-pay

## 2024-06-19 ENCOUNTER — Ambulatory Visit
Admission: RE | Admit: 2024-06-19 | Discharge: 2024-06-19 | Disposition: A | Source: Ambulatory Visit | Attending: Internal Medicine

## 2024-06-19 DIAGNOSIS — Z1231 Encounter for screening mammogram for malignant neoplasm of breast: Secondary | ICD-10-CM

## 2024-06-22 ENCOUNTER — Other Ambulatory Visit: Payer: Self-pay | Admitting: Internal Medicine

## 2024-06-22 DIAGNOSIS — R928 Other abnormal and inconclusive findings on diagnostic imaging of breast: Secondary | ICD-10-CM

## 2024-06-26 ENCOUNTER — Other Ambulatory Visit: Payer: Self-pay | Admitting: Internal Medicine

## 2024-06-26 ENCOUNTER — Ambulatory Visit
Admission: RE | Admit: 2024-06-26 | Discharge: 2024-06-26 | Disposition: A | Source: Ambulatory Visit | Attending: Internal Medicine

## 2024-06-26 DIAGNOSIS — R921 Mammographic calcification found on diagnostic imaging of breast: Secondary | ICD-10-CM

## 2024-06-26 DIAGNOSIS — R928 Other abnormal and inconclusive findings on diagnostic imaging of breast: Secondary | ICD-10-CM

## 2024-07-06 ENCOUNTER — Ambulatory Visit
Admission: RE | Admit: 2024-07-06 | Discharge: 2024-07-06 | Disposition: A | Discharge status: Home / Self Care | Source: Ambulatory Visit | Attending: Internal Medicine | Admitting: Internal Medicine

## 2024-07-06 DIAGNOSIS — R921 Mammographic calcification found on diagnostic imaging of breast: Secondary | ICD-10-CM

## 2024-07-07 ENCOUNTER — Telehealth: Payer: Self-pay | Admitting: *Deleted

## 2024-07-07 LAB — SURGICAL PATHOLOGY

## 2024-07-07 NOTE — Telephone Encounter (Signed)
 Spoke to patient to confirm upcoming afternoon [facility] clinic appointment on 2/4, paperwork will be sent via email.  Gave location and time, also informed patient that the surgeon's office would be calling as well to get information from them similar to the packet that they will be receiving so make sure to do both.  Reminded patient that all providers will be coming to the clinic to see them HERE and if they had any questions to not hesitate to reach back out to myself or their navigators.

## 2024-07-10 ENCOUNTER — Encounter: Payer: Self-pay | Admitting: *Deleted

## 2024-07-10 DIAGNOSIS — D0511 Intraductal carcinoma in situ of right breast: Secondary | ICD-10-CM | POA: Insufficient documentation

## 2024-07-12 ENCOUNTER — Inpatient Hospital Stay: Admitting: Hematology and Oncology

## 2024-07-12 ENCOUNTER — Encounter: Payer: Self-pay | Admitting: *Deleted

## 2024-07-12 ENCOUNTER — Inpatient Hospital Stay

## 2024-07-12 ENCOUNTER — Ambulatory Visit
Admission: RE | Admit: 2024-07-12 | Discharge: 2024-07-12 | Attending: Radiation Oncology | Admitting: Radiation Oncology

## 2024-07-12 ENCOUNTER — Other Ambulatory Visit: Payer: Self-pay | Admitting: Surgery

## 2024-07-12 VITALS — BP 154/63 | HR 74 | Temp 98.2°F | Resp 17 | Ht 65.35 in | Wt 134.1 lb

## 2024-07-12 DIAGNOSIS — Z8051 Family history of malignant neoplasm of kidney: Secondary | ICD-10-CM

## 2024-07-12 DIAGNOSIS — D0511 Intraductal carcinoma in situ of right breast: Secondary | ICD-10-CM

## 2024-07-12 DIAGNOSIS — Z803 Family history of malignant neoplasm of breast: Secondary | ICD-10-CM

## 2024-07-12 DIAGNOSIS — Z808 Family history of malignant neoplasm of other organs or systems: Secondary | ICD-10-CM

## 2024-07-12 LAB — CMP (CANCER CENTER ONLY)
ALT: 27 U/L (ref 0–44)
AST: 29 U/L (ref 15–41)
Albumin: 4.8 g/dL (ref 3.5–5.0)
Alkaline Phosphatase: 86 U/L (ref 38–126)
Anion gap: 12 (ref 5–15)
BUN: 11 mg/dL (ref 8–23)
CO2: 25 mmol/L (ref 22–32)
Calcium: 10 mg/dL (ref 8.9–10.3)
Chloride: 102 mmol/L (ref 98–111)
Creatinine: 0.75 mg/dL (ref 0.44–1.00)
GFR, Estimated: >60 mL/min
Glucose, Bld: 100 mg/dL — ABNORMAL HIGH (ref 70–99)
Potassium: 3.8 mmol/L (ref 3.5–5.1)
Sodium: 139 mmol/L (ref 135–145)
Total Bilirubin: 0.8 mg/dL (ref 0.0–1.2)
Total Protein: 7.9 g/dL (ref 6.5–8.1)

## 2024-07-12 LAB — CBC WITH DIFFERENTIAL (CANCER CENTER ONLY)
Abs Immature Granulocytes: 0.02 10*3/uL (ref 0.00–0.07)
Basophils Absolute: 0.1 10*3/uL (ref 0.0–0.1)
Basophils Relative: 1 %
Eosinophils Absolute: 0.1 10*3/uL (ref 0.0–0.5)
Eosinophils Relative: 2 %
HCT: 41.4 % (ref 36.0–46.0)
Hemoglobin: 14.3 g/dL (ref 12.0–15.0)
Immature Granulocytes: 0 %
Lymphocytes Relative: 25 %
Lymphs Abs: 1.8 10*3/uL (ref 0.7–4.0)
MCH: 31.3 pg (ref 26.0–34.0)
MCHC: 34.5 g/dL (ref 30.0–36.0)
MCV: 90.6 fL (ref 80.0–100.0)
Monocytes Absolute: 0.6 10*3/uL (ref 0.1–1.0)
Monocytes Relative: 8 %
Neutro Abs: 4.5 10*3/uL (ref 1.7–7.7)
Neutrophils Relative %: 64 %
Platelet Count: 247 10*3/uL (ref 150–400)
RBC: 4.57 MIL/uL (ref 3.87–5.11)
RDW: 12.3 % (ref 11.5–15.5)
WBC Count: 7 10*3/uL (ref 4.0–10.5)
nRBC: 0 % (ref 0.0–0.2)

## 2024-07-12 LAB — GENETIC SCREENING ORDER

## 2024-07-12 NOTE — Progress Notes (Unsigned)
 REFERRING PROVIDER: Vernetta Berg, MD [address] [place],  [place] [address]  PRIMARY PROVIDER:  Vernon Velna SAUNDERS, MD  PRIMARY REASON FOR VISIT:  1. Ductal carcinoma in situ (DCIS) of right breast     HISTORY OF PRESENT ILLNESS:   Karla [name], a 69 y.o. female, was seen for a Karla [name] cancer genetics consultation at the request of [name] Vernetta, MD due to a personal history of breast cancer.  Karla [name] presents today the at the [facility] to discuss the possibility of a hereditary predisposition to cancer, genetic testing, and to further clarify her future cancer risks, as well as potential cancer risks for family members.  Diagnosis: In January 2026, at the age of 87, Karla [name] was diagnosed with diagnosed with DCIS of the right breast.   CANCER HISTORY:  Oncology History  Ductal carcinoma in situ (DCIS) of right breast  07/06/2024 Initial Diagnosis   Screening mammogram detected calcifications right breast UOQ 0.6 cm, stereotactic biopsy: High-grade DCIS cribriform type ER 0%, PR 0%    RISK FACTORS:  Menarche was at age 35.  First live birth at age 80.  OCP use for approximately 11 years.  Oophorectomy: yes (2008).  Hysterectomy: yes (2008).  Menopausal status: postmenopausal.  HRT use: 0 years. Colonoscopy: yes Mammogram within the last year: yes. Number of breast biopsies: 1. Up to date with pelvic exams: yes.  Past Medical History:  Diagnosis Date   Hypertension    Osteoporosis    Psoriasis    Past Surgical History:  Procedure Laterality Date   ABDOMINAL HYSTERECTOMY     ARTHROSCOPIC REPAIR ACL Left    BREAST BIOPSY Right 07/06/2024   MM RT BREAST BX W LOC DEV 1ST LESION IMAGE BX SPEC STEREO GUIDE 07/06/2024 [facility]   RHINOPLASTY     Social History   Socioeconomic History   Marital status: Divorced    Spouse name: Not on file   Number of children: Not on file   Years of education: Not on file    Highest education level: Not on file  Occupational History   Not on file  Tobacco Use   Smoking status: Former    Current packs/day: 0.00    Average packs/day: 1 pack/day for 35.0 years (35.0 ttl pk-yrs)    Types: Cigarettes    Start date: 06/08/1974    Quit date: 06/08/2009    Years since quitting: 15.1   Smokeless tobacco: Former  Substance and Sexual Activity   Alcohol use: Not on file    Comment: maybe 1 glass wine per day (11/28/15)   Drug use: Not on file   Sexual activity: Not on file  Other Topics Concern   Not on file  Social History Narrative   Not on file   Social Drivers of Health   Tobacco Use: Medium Risk (07/12/2024)   Patient History    Smoking Tobacco Use: Former    Smokeless Tobacco Use: Former    Passive Exposure: Not on Stage Manager: Not on file  Food Insecurity: Not on file  Transportation Needs: Not on file  Physical Activity: Not on file  Stress: Not on file  Social Connections: Not on file  Depression (PHQ2-9): Not on file  Alcohol Screen: Not on file  Housing: Unknown (07/08/2024)   Received from [facility]   Epic    Unable to Pay for Housing in the Last Year: Not on file    Number  of Times Moved in the Last Year: Not on file    At any time in the past 12 months, were you homeless or living in a shelter (including now)?: No  Utilities: Not on file  Health Literacy: Not on file    FAMILY HISTORY:  We obtained a detailed, 4-generation family history pasted below.   Karla [name] is unaware of relatives completing genetic testing for hereditary cancer risks.  There is no reported Ashkenazi Jewish ancestry.  There is no known consanguinity.  Pedigree Summary  Mother - Multiple Primary Breast Cancers dx.68, dx.73, dx. 61 Maternal Uncle - Brain Cancer dx. 45 Paternal Uncle - Kidney Cancer dx. 7's   Paternal Grandfather - Brain Cancer Sister of Paternal Grandmother - Lung Cancer  Significant diagnoses are listed  below: Family History  Problem Relation Age of Onset   Breast cancer Mother 35       w/ recurrence (x2); s/p mastectomy   Breast cancer Maternal Grandmother 82   Lung cancer Maternal Grandfather 3       former smoker   Cancer Paternal Grandfather        possible brain cancer; dx. 32s   Brain cancer Maternal Uncle        dx. late 68s; NOS type   Kidney cancer Paternal Aunt        NOS type; dx. 36s; former smoker   Cancer Other        maternal great aunt (MGF's sister) dx NOS cancer at later age   Cancer Other        maternal great uncle (MGF's brother) dx NOS cancer at later age   Lung cancer Other        paternal great uncle (PGM's brother) d. 33s; +smoker    GENETIC COUNSELING ASSESSMENT: Karla [name] is a 69 y.o. female with a personal and family history of cancer which is somewhat suggestive of a hereditary cancer predisposition syndrome. We, therefore, discussed and recommended the following at today's visit.   DISCUSSION: We discussed that, in general, most cancer is not inherited in families, but instead is sporadic or familial. Sporadic cancers occur by chance and typically happen at older ages (>50 years) as this type of cancer is caused by genetic changes acquired during an individuals lifetime. Some families have more cancers than would be expected by chance; however, the ages or types of cancer are not consistent with a known genetic mutation or known genetic mutations have been ruled out. This type of familial cancer is thought to be due to a combination of multiple genetic, environmental, hormonal, and lifestyle factors. While this combination of factors likely increases the risk of cancer, the exact source of this risk is not currently identifiable or testable.  Previous Karla [name] (Full Report in the Lab Tap)   We discussed that 5-10% of cancer is the result of germline (heritable) genetic variants, with most cases associated with BRCA1/BRCA2. Yet, Karla [name] did complete  genetic testing in 2017 and was not found to have any variants in the BRCA1 or BRCA2 genes. At that time she had a multi-cancer panel (31 genes) that included the majority of genes most commonly associated with hereditary breast cancer syndromes. Though NF1 was not on the panel which will be included on this panel. We discussed that it is unlikely that we identify a pathogenic variant associated with hereditary breast cancer, since her previous testing was negative.  We discussed that testing is beneficial for several reasons including knowing how to follow  individuals after completing their treatment, identifying whether potential treatment options such as PARP inhibitors would be beneficial, and understanding if other family members could be at risk for cancer and allow them to undergo genetic testing.   We reviewed the characteristics, features and inheritance patterns of hereditary cancer syndromes. We also discussed genetic testing, including the appropriate family members to test, the process of testing, insurance coverage and turn-around-time for results. We discussed the implications of a negative, positive, carrier and/or variant of uncertain significant result. Karla [name]  was offered a common hereditary cancer panel (40 genes) and an expanded pan-cancer panel (77 genes). Karla [name] was informed of the benefits and limitations of each panel, including that expanded pan-cancer panels contain genes that do not have clear management guidelines at this point in time.  We also discussed that as the number of genes included on a panel increases, the chances of variants of uncertain significance increases.  GENETIC TESTING NATIONAL CRITERIA: Based on Karla [name]'s personal and family history of three maternal relatives with breast cancer including herself she meets medical criteria for genetic testing based on the [employer] (NCCN) guidelines.  Despite that she meets criteria,  she  may still have an out of pocket cost.We discussed that if her out of pocket cost for testing is over $100, the laboratory will send a text with the estimated out-of-pocket cost.  If the out of pocket cost of testing is less than $100 she will be billed by the genetic testing laboratory.   GENETIC TESTING CONSENT:  After considering the risks, benefits, and limitations, Karla [name] provided informed consent to pursue updated genetic testing. A blood sample was sent to [facility] for analysis of the CancerNext-Expanded+RNA Panel. Results should be available within approximately 2-3 weeks' time, at which point they will be disclosed by telephone to Karla [name] , as will any additional recommendations warranted by these results. Karla [name] will receive a summary of her genetic counseling visit and a copy of her results once available. This information will also be available in Epic.  [facility] CancerNext-Expanded + RNAinsight gene panel which includes sequencing, rearrangement, and RNA analysis for the following 77 genes: AIP, ALK, APC, ATM, AXIN2, BAP1, BARD1, BMPR1A, BRCA1, BRCA2, BRIP1, CDC73, CDH1, CDK4, CDKN1B, CDKN2A, CEBPA, CHEK2, CTNNA1, DDX41, DICER1, ETV6, FH, FLCN, GATA2, LZTR1, MAX, MBD4, MEN1, MET, MLH1, MSH2, MSH3, MSH6, MUTYH, NF1, NF2, NTHL1, PALB2, PHOX2B, PMS2, POT1, PRKAR1A, PTCH1, PTEN, RAD51C, RAD51D, RB1, RET, RPS20, RUNX1, SDHA, SDHAF2, SDHB, SDHC, SDHD, SMAD4, SMARCA4, SMARCB1, SMARCE1, STK11, SUFU, TMEM127, TP53, TSC1, TSC2, VHL, and WT1 (sequencing and deletion/duplication); EGFR, HOXB13, KIT, MITF, PDGFRA, POLD1, and POLE (sequencing only); EPCAM and GREM1 (deletion/duplication only).   GENETIC INFORMATION NONDISCRIMINATION ACT (GINA): We discussed that some people do not want to undergo genetic testing due to fear of genetic discrimination.  The Genetic Information Nondiscrimination Act (GINA) was signed into federal law in 2008. GINA prohibits health insurers and most employers  from discriminating against individuals based on genetic information (including the results of genetic tests and family history information). According to GINA, health insurance companies cannot consider genetic information to be a preexisting condition, nor can they use it to make decisions regarding coverage or rates. GINA also makes it illegal for most employers to use genetic information in making decisions about hiring, firing, promotion, or terms of employment. It is important to note that GINA does not offer protections for life insurance, disability insurance, or long-term care insurance. GINA does not  apply to those in the [employer], those who work for companies with less than 15 employees, and new life insurance or long-term disability medical illustrator.  Health status due to a cancer diagnosis is not protected under GINA. More information about GINA can be found by visiting eliteclients.be. Lastly, we encouraged Karla [name] to remain in contact with cancer genetics annually so that we can continuously update the family history and inform her of any changes in cancer genetics and testing that may be of benefit for this family.   Karla [name]'s questions were answered to her satisfaction today. Our contact information was provided should additional questions or concerns arise. Thank you for the referral and allowing us  to share in the care of your patient.   Resources:  Anelly Samarin was provided with the following:  [employer] Hereditary Cancer Testing Patient Guide PLAN:  Testing Ordered: CancerNext-Expanded + RNAinsight   Karla Shampine Lindsey-Mills, MS, Magazine Features Editor  Email: [email or web address]  Phone: [phone]  I personally spent a total of 20 minutes in the care of the patient today including preparing to see the patient, counseling and educating, and documenting clinical information in the EHR.  The patient was joined by  two friends. Drs. Lanny Stalls, and/or Gudena were available for questions, if needed. _______________________________________________________________________ For Office Staff:  Number of people involved in session: 3 Was an Intern/ student involved with case: no

## 2024-07-12 NOTE — Progress Notes (Signed)
 [facility] CONSULT NOTE  Patient Care Team: Vernon Velna SAUNDERS, MD as PCP - General (Internal Medicine) Tyree Nanetta SAILOR, RN as Oncology Nurse Navigator Vernetta Berg, MD as Consulting Physician (General Surgery) Odean Potts, MD as Consulting Physician (Hematology and Oncology) Izell Domino, MD as Attending Physician (Radiation Oncology)  CHIEF COMPLAINTS/PURPOSE OF CONSULTATION:  Newly diagnosed right breast DCIS  HISTORY OF PRESENTING ILLNESS:  Karla [name] a 69 year old who was recently diagnosed with DCIS of the right breast.  She has screening mammogram that detected 0.6 cm of calcifications upper outer quadrant.  Stereotactic biopsy revealed high-grade DCIS cribriform type with calcifications that was ER/PR negative.  She was presented this morning to the multidiscipline tumor board and she is here today accompanied by her family to discuss her treatment plan.  She has extensive family history of breast cancer with her grandmother a 28 and a mother having had 2 recurrences and also family history of brain and kidney cancers.  I reviewed her records extensively and collaborated the history with the patient.  SUMMARY OF ONCOLOGIC HISTORY: Oncology History  Ductal carcinoma in situ (DCIS) of right breast  07/06/2024 Initial Diagnosis   Screening mammogram detected calcifications right breast UOQ 0.6 cm, stereotactic biopsy: High-grade DCIS cribriform type ER 0%, PR 0%      MEDICAL HISTORY:  Past Medical History:  Diagnosis Date   Psoriasis     SURGICAL HISTORY: Past Surgical History:  Procedure Laterality Date   ABDOMINAL HYSTERECTOMY     ARTHROSCOPIC REPAIR ACL Left    BREAST BIOPSY Right 07/06/2024   MM RT BREAST BX W LOC DEV 1ST LESION IMAGE BX SPEC STEREO GUIDE 07/06/2024 [facility]   RHINOPLASTY      SOCIAL HISTORY: Social History   Socioeconomic History   Marital status: Divorced    Spouse name: Not on file   Number of children: Not on file    Years of education: Not on file   Highest education level: Not on file  Occupational History   Not on file  Tobacco Use   Smoking status: Former    Current packs/day: 0.00    Average packs/day: 1 pack/day for 35.0 years (35.0 ttl pk-yrs)    Types: Cigarettes    Start date: 06/08/1974    Quit date: 06/08/2009    Years since quitting: 15.1   Smokeless tobacco: Never  Substance and Sexual Activity   Alcohol use: Not on file    Comment: maybe 1 glass wine per day (11/28/15)   Drug use: Not on file   Sexual activity: Not on file  Other Topics Concern   Not on file  Social History Narrative   Not on file   Social Drivers of Health   Tobacco Use: Medium Risk (07/12/2024)   Received from [facility]   Patient History    Smoking Tobacco Use: Former    Smokeless Tobacco Use: Never    Passive Exposure: Not on Actuary Strain: Not on file  Food Insecurity: Not on file  Transportation Needs: Not on file  Physical Activity: Not on file  Stress: Not on file  Social Connections: Not on file  Intimate Partner Violence: Unknown (09/11/2021)   Received from [facility]   HITS    Physically Hurt: Not on file    Insult or Talk Down To: Not on file    Threaten Physical Harm: Not on file    Scream or Curse: Not on file  Depression (EYV7-0): Not  on file  Alcohol Screen: Not on file  Housing: Unknown (07/08/2024)   Received from [facility]   Epic    Unable to Pay for Housing in the Last Year: Not on file    Number of Times Moved in the Last Year: Not on file    At any time in the past 12 months, were you homeless or living in a shelter (including now)?: No  Utilities: Not on file  Health Literacy: Not on file    FAMILY HISTORY: Family History  Problem Relation Age of Onset   Breast cancer Mother 65       w/ recurrence (x2); s/p mastectomy   Brain cancer Maternal Uncle        dx. late 51s; NOS type   Kidney cancer Paternal Aunt         NOS type; dx. 65s; former smoker   Breast cancer Maternal Grandmother 7   Lung cancer Maternal Grandfather 59       former smoker   Cancer Paternal Grandfather        possible brain cancer; dx. 55s   Cancer Other        maternal great aunt (MGF's sister) dx NOS cancer at later age   Cancer Other        maternal great uncle (MGF's brother) dx NOS cancer at later age   Lung cancer Other        paternal great uncle (PGM's brother) d. 49s; +smoker    ALLERGIES:  is allergic to sulfa antibiotics.  MEDICATIONS:  Current Outpatient Medications  Medication Sig Dispense Refill   amLODipine (NORVASC) 5 MG tablet Take 5 mg by mouth daily.     cephALEXin  (KEFLEX ) 500 MG capsule Take 1 capsule (500 mg total) by mouth 2 (two) times daily. (Patient not taking: Reported on 05/15/2024) 14 capsule 0   montelukast (SINGULAIR) 10 MG tablet Take by mouth.     omeprazole (PRILOSEC) 20 MG capsule Take 20 mg by mouth daily.     pravastatin (PRAVACHOL) 40 MG tablet Take by mouth.     STELARA 45 MG/0.5ML SOSY injection Inject into the skin.     terconazole  (TERAZOL 7 ) 0.4 % vaginal cream Place 1 applicator vaginally at bedtime. (Patient not taking: Reported on 05/15/2024) 45 g 0   valACYclovir (VALTREX) 500 MG tablet Take 500 mg by mouth daily. (Patient not taking: Reported on 05/15/2024)     No current facility-administered medications for this visit.    REVIEW OF SYSTEMS:   Constitutional: Denies fevers, chills or abnormal night sweats Breast:  Denies any palpable lumps or discharge All other systems were reviewed with the patient and are negative.  PHYSICAL EXAMINATION: ECOG PERFORMANCE STATUS: 1 - Symptomatic but completely ambulatory  Vitals:   07/12/24 1248  BP: (!) 154/63  Pulse: 74  Resp: 17  Temp: 98.2 F (36.8 C)  SpO2: 98%   Filed Weights   07/12/24 1248  Weight: 134 lb 1.6 oz (60.8 kg)    GENERAL:alert, no distress and comfortable    LABORATORY DATA:  I have reviewed the  data as listed Lab Results  Component Value Date   WBC 7.0 07/12/2024   HGB 14.3 07/12/2024   HCT 41.4 07/12/2024   MCV 90.6 07/12/2024   PLT 247 07/12/2024   Lab Results  Component Value Date   NA 139 07/12/2024   K 3.8 07/12/2024   CL 102 07/12/2024   CO2 25 07/12/2024    RADIOGRAPHIC STUDIES:  I have personally reviewed the radiological reports and agreed with the findings in the report.  ASSESSMENT AND PLAN:  Ductal carcinoma in situ (DCIS) of right breast 07/06/2024: Screening mammogram detected calcifications right breast UOQ 0.6 cm, stereotactic biopsy: High-grade DCIS cribriform type ER 0%, PR 0% Family history: Grandmother 67 breast cancer, mother recurrence x 2, history of brain cancer and kidney cancers in the family  Pathology review: I discussed with the patient the difference between DCIS and invasive breast cancer. It is considered a precancerous lesion. DCIS is classified as a 0. It is generally detected through mammograms as calcifications. We discussed the significance of grades and its impact on prognosis. We also discussed the importance of ER and PR receptors and their implications to adjuvant treatment options. Prognosis of DCIS dependence on grade, comedo necrosis. It is anticipated that if not treated, 20-30% of DCIS can develop into invasive breast cancer.  Recommendation: 1. Breast conserving surgery 2. Followed by adjuvant radiation therapy  Return to clinic after surgery to discuss the final pathology report and come up with an adjuvant treatment plan.     All questions were answered. The patient knows to call the clinic with any problems, questions or concerns. I personally spent a total of 30 minutes in the care of the patient today including preparing to see the patient, getting/reviewing separately obtained history, performing a medically appropriate exam/evaluation, counseling and educating, placing orders, referring and communicating with other  health care professionals, documenting clinical information in the EHR, independently interpreting results, communicating results, and coordinating care.   Viinay K Casondra Gasca, MD 07/12/24

## 2024-07-12 NOTE — Assessment & Plan Note (Signed)
 07/06/2024: Screening mammogram detected calcifications right breast UOQ 0.6 cm, stereotactic biopsy: High-grade DCIS cribriform type ER 0%, PR 0% Family history: Grandmother 51 breast cancer, mother recurrence x 2, history of brain cancer and kidney cancers in the family  Pathology review: I discussed with the patient the difference between DCIS and invasive breast cancer. It is considered a precancerous lesion. DCIS is classified as a 0. It is generally detected through mammograms as calcifications. We discussed the significance of grades and its impact on prognosis. We also discussed the importance of ER and PR receptors and their implications to adjuvant treatment options. Prognosis of DCIS dependence on grade, comedo necrosis. It is anticipated that if not treated, 20-30% of DCIS can develop into invasive breast cancer.  Recommendation: 1. Breast conserving surgery 2. Followed by adjuvant radiation therapy  Return to clinic after surgery to discuss the final pathology report and come up with an adjuvant treatment plan.

## 2024-07-13 ENCOUNTER — Encounter: Payer: Self-pay | Admitting: General Practice

## 2024-07-13 ENCOUNTER — Encounter (HOSPITAL_COMMUNITY): Admission: RE | Admit: 2024-07-13 | Discharge: 2024-07-13 | Attending: Surgery | Admitting: Surgery

## 2024-07-13 DIAGNOSIS — Z808 Family history of malignant neoplasm of other organs or systems: Secondary | ICD-10-CM | POA: Insufficient documentation

## 2024-07-13 DIAGNOSIS — Z8051 Family history of malignant neoplasm of kidney: Secondary | ICD-10-CM | POA: Insufficient documentation

## 2024-07-13 DIAGNOSIS — D0511 Intraductal carcinoma in situ of right breast: Secondary | ICD-10-CM

## 2024-07-13 MED ORDER — GADOBUTROL 1 MMOL/ML IV SOLN
6.0000 mL | Freq: Once | INTRAVENOUS | Status: AC | PRN
  Administered 2024-07-13: 6 mL via INTRAVENOUS

## 2024-07-13 NOTE — Progress Notes (Signed)
 [facility] Multidisciplinary Clinic Spiritual Care Note  Met with Karla [name], along with her adopted sister and a close friend, in Breast Multidisciplinary Clinic to introduce Support Center team/resources.  [name] completed SDOH screening; results follow below.    SDOH Screenings   Food Insecurity: No Food Insecurity (07/13/2024)  Housing: Low Risk (07/13/2024)  Transportation Needs: No Transportation Needs (07/13/2024)  Utilities: Not At Risk (07/13/2024)  Depression (PHQ2-9): Low Risk (07/13/2024)  Tobacco Use: Medium Risk (07/12/2024)   Chaplain and patient discussed common feelings and emotions when being diagnosed with cancer, and the importance of support during treatment.  Chaplain informed patient of the support team and support services at [facility].  Chaplain provided contact information and encouraged patient to call with any questions or concerns.  Follow up needed: Yes.  We plan a follow-up phone call in ca two weeks for Spiritual Care check-in.  [address] [place], [place], [facility] Pager [phone] Voicemail [phone]
